# Patient Record
Sex: Female | Born: 1985
Health system: Southern US, Community
[De-identification: ages and names within clinical notes are randomized; demographics above are authoritative.]

## PROBLEM LIST (undated history)

## (undated) DIAGNOSIS — Z973 Presence of spectacles and contact lenses: Secondary | ICD-10-CM

## (undated) DIAGNOSIS — J302 Other seasonal allergic rhinitis: Secondary | ICD-10-CM

## (undated) DIAGNOSIS — D509 Iron deficiency anemia, unspecified: Secondary | ICD-10-CM

## (undated) DIAGNOSIS — L659 Nonscarring hair loss, unspecified: Secondary | ICD-10-CM

## (undated) DIAGNOSIS — K603 Anal fistula, unspecified: Secondary | ICD-10-CM

## (undated) DIAGNOSIS — Z8719 Personal history of other diseases of the digestive system: Secondary | ICD-10-CM

## (undated) DIAGNOSIS — Z9109 Other allergy status, other than to drugs and biological substances: Secondary | ICD-10-CM

## (undated) DIAGNOSIS — J3089 Other allergic rhinitis: Secondary | ICD-10-CM

## (undated) DIAGNOSIS — J45909 Unspecified asthma, uncomplicated: Secondary | ICD-10-CM

## (undated) DIAGNOSIS — E049 Nontoxic goiter, unspecified: Secondary | ICD-10-CM

## (undated) DIAGNOSIS — K589 Irritable bowel syndrome without diarrhea: Secondary | ICD-10-CM

## (undated) DIAGNOSIS — F419 Anxiety disorder, unspecified: Secondary | ICD-10-CM

## (undated) HISTORY — DX: Nonscarring hair loss, unspecified: L65.9

## (undated) HISTORY — PX: COLONOSCOPY: SHX174

## (undated) HISTORY — DX: Anxiety disorder, unspecified: F41.9

---
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
## 2008-06-12 ENCOUNTER — Encounter: Payer: Self-pay | Admitting: Internal Medicine

## 2008-08-15 ENCOUNTER — Encounter (INDEPENDENT_AMBULATORY_CARE_PROVIDER_SITE_OTHER): Payer: Self-pay | Admitting: *Deleted

## 2008-08-28 ENCOUNTER — Ambulatory Visit: Payer: Self-pay | Admitting: Internal Medicine

## 2008-08-28 DIAGNOSIS — F3289 Other specified depressive episodes: Secondary | ICD-10-CM | POA: Insufficient documentation

## 2008-08-28 DIAGNOSIS — J45909 Unspecified asthma, uncomplicated: Secondary | ICD-10-CM | POA: Insufficient documentation

## 2008-08-28 DIAGNOSIS — J309 Allergic rhinitis, unspecified: Secondary | ICD-10-CM | POA: Insufficient documentation

## 2008-08-28 DIAGNOSIS — F411 Generalized anxiety disorder: Secondary | ICD-10-CM | POA: Insufficient documentation

## 2008-08-28 DIAGNOSIS — F329 Major depressive disorder, single episode, unspecified: Secondary | ICD-10-CM | POA: Insufficient documentation

## 2008-08-29 LAB — CONVERTED CEMR LAB
Alkaline Phosphatase: 56 units/L (ref 39–117)
Basophils Absolute: 0 10*3/uL (ref 0.0–0.1)
Bilirubin Urine: NEGATIVE
Bilirubin, Direct: 0.1 mg/dL (ref 0.0–0.3)
Calcium: 9 mg/dL (ref 8.4–10.5)
Crystals: NEGATIVE
Eosinophils Absolute: 0 10*3/uL (ref 0.0–0.7)
GFR calc Af Amer: 158 mL/min
GFR calc non Af Amer: 131 mL/min
HCT: 37.3 % (ref 36.0–46.0)
HDL: 64.5 mg/dL (ref >39.0)
Hemoglobin, Urine: NEGATIVE
Ketones, ur: NEGATIVE mg/dL
MCHC: 34.9 g/dL (ref 30.0–36.0)
MCV: 83.7 fL (ref 78.0–100.0)
Monocytes Absolute: 0.4 10*3/uL (ref 0.1–1.0)
Monocytes Relative: 5.1 % (ref 3.0–12.0)
Nitrite: NEGATIVE
Platelets: 260 10*3/uL (ref 150–400)
Potassium: 4 meq/L (ref 3.5–5.1)
RBC / HPF: NONE SEEN
RDW: 12.1 % (ref 11.5–14.6)
Sodium: 140 meq/L (ref 135–145)
TSH: 2.73 microintl units/mL (ref 0.35–5.50)
Total CHOL/HDL Ratio: 3.3
Total Protein, Urine: NEGATIVE mg/dL
Triglycerides: 137 mg/dL (ref 0–149)

## 2009-06-14 ENCOUNTER — Encounter: Admission: RE | Admit: 2009-06-14 | Discharge: 2009-06-14 | Payer: Self-pay | Admitting: Family Medicine

## 2012-04-01 ENCOUNTER — Encounter (INDEPENDENT_AMBULATORY_CARE_PROVIDER_SITE_OTHER): Payer: Self-pay | Admitting: Surgery

## 2012-04-01 ENCOUNTER — Ambulatory Visit (INDEPENDENT_AMBULATORY_CARE_PROVIDER_SITE_OTHER): Payer: BC Managed Care – PPO | Admitting: Surgery

## 2012-04-01 ENCOUNTER — Encounter (INDEPENDENT_AMBULATORY_CARE_PROVIDER_SITE_OTHER): Payer: Self-pay | Admitting: General Surgery

## 2012-04-01 VITALS — BP 118/78 | HR 76 | Temp 98.3°F | Resp 16 | Ht 63.0 in | Wt 164.0 lb

## 2012-04-01 DIAGNOSIS — K611 Rectal abscess: Secondary | ICD-10-CM

## 2012-04-01 DIAGNOSIS — K612 Anorectal abscess: Secondary | ICD-10-CM

## 2012-04-01 MED ORDER — OXYCODONE-ACETAMINOPHEN 5-325 MG PO TABS: 1.0000 | ORAL_TABLET | Freq: Four times a day (QID) | ORAL | Status: DC | PRN

## 2012-04-01 MED ORDER — AMOXICILLIN-POT CLAVULANATE 875-125 MG PO TABS: 1.0000 | ORAL_TABLET | Freq: Two times a day (BID) | ORAL | Status: AC

## 2012-04-01 NOTE — Progress Notes (Signed)
URGENT Office Renee Bryan 26 y.o.  Body mass index is 29.05 kg/(m^2).  There is no problem list on file for this patient.   Allergies  Allergen Reactions  . Zoloft (Sertraline Hcl)     No past surgical history on file. Renee Bryan, [name], MD 1. Peri-rectal abscess     5 day history of gradually worsening perirectal pain.  Now with firm tender area of right buttock.  Informed consent and infiltration with 1%lido with neut.  18 gauge needle probe to reach yellow pus.  Incised and drained.  Packed with iodophor.  Instructions given.  Augmentin and percocet. Perirectal abscess post drainage Return 1 week.    Matt B. Daphine Deutscher, MD, [facility] [phone] beeper [phone]  04/01/2012 6:10 PM

## 2012-04-01 NOTE — Patient Instructions (Signed)
Peri-Rectal Abscess Your caregiver has diagnosed you as having a peri-rectal abscess. This is an infected area near the rectum that is filled with pus. If the abscess is near the surface of the skin, your caregiver may open (incise) the area and drain the pus. HOME CARE INSTRUCTIONS   If your abscess was opened up and drained. A small piece of gauze may be placed in the opening so that it can drain. Do not remove the gauze unless directed by your caregiver.   A loose dressing may be placed over the abscess site. Change the dressing as often as necessary to keep it clean and dry.   After the drain is removed, the area may be washed with a gentle antiseptic (soap) four times per day.   A warm sitz bath, warm packs or heating pad may be used for pain relief, taking care not to burn yourself.   Return for a wound check in 1 day or as directed.   An "inflatable doughnut" may be used for sitting with added comfort. These can be purchased at a drugstore or medical supply house.   To reduce pain and straining with bowel movements, eat a high fiber diet with plenty of fruits and vegetables. Use stool softeners as recommended by your caregiver. This is especially important if narcotic type pain medications were prescribed as these may cause marked constipation.   Only take over-the-counter or prescription medicines for pain, discomfort, or fever as directed by your caregiver.  SEEK IMMEDIATE MEDICAL CARE IF:   You have increasing pain that is not controlled by medication.   There is increased inflammation (redness), swelling, bleeding, or drainage from the area.   An oral temperature above 102 F (38.9 C) develops.   You develop chills or generalized malaise (feel lethargic or feel "washed out").   You develop any new symptoms (problems) you feel may be related to your present problem.  Document Released: 06/26/2000 Document Revised: 06/18/2011 Document Reviewed: 06/26/2008 ExitCare Patient  Information 2012 [employer]. 

## 2012-04-08 ENCOUNTER — Ambulatory Visit (INDEPENDENT_AMBULATORY_CARE_PROVIDER_SITE_OTHER): Payer: BC Managed Care – PPO | Admitting: General Surgery

## 2012-04-08 ENCOUNTER — Encounter (INDEPENDENT_AMBULATORY_CARE_PROVIDER_SITE_OTHER): Payer: Self-pay | Admitting: General Surgery

## 2012-04-08 VITALS — BP 126/74 | HR 70 | Temp 97.6°F | Resp 14 | Ht 63.0 in | Wt 165.4 lb

## 2012-04-08 DIAGNOSIS — K611 Rectal abscess: Secondary | ICD-10-CM

## 2012-04-08 DIAGNOSIS — K612 Anorectal abscess: Secondary | ICD-10-CM

## 2012-04-08 NOTE — Patient Instructions (Signed)
Your abscess is healing well.  Continue your antibiotics for another week.

## 2012-04-08 NOTE — Progress Notes (Signed)
Renee Bryan is a 26 y.o. female who is here for a follow up visit regarding a perirectal abscess.  It was drained 1 week ago.  Her pain and erythema are better but not completely gone.  Objective: Filed Vitals:   04/08/12 1608  BP: 126/74  Pulse: 70  Temp: 97.6 F (36.4 C)  Resp: 14    General appearance: alert, cooperative and no distress no perirectal erythema/edema, mild tenderness to deep palpation   Assessment and Plan: Healing well  RTC PRN    .Vanita Panda, MD [facility], [place] [phone]

## 2014-04-04 LAB — OB RESULTS CONSOLE RUBELLA ANTIBODY, IGM: Rubella: IMMUNE

## 2014-04-04 LAB — OB RESULTS CONSOLE GC/CHLAMYDIA
CHLAMYDIA, DNA PROBE: NEGATIVE
Gonorrhea: NEGATIVE

## 2014-04-04 LAB — OB RESULTS CONSOLE RPR: RPR: NONREACTIVE

## 2014-04-04 LAB — OB RESULTS CONSOLE HEPATITIS B SURFACE ANTIGEN: Hepatitis B Surface Ag: NEGATIVE

## 2014-04-04 LAB — OB RESULTS CONSOLE ABO/RH: RH TYPE: NEGATIVE

## 2014-04-04 LAB — OB RESULTS CONSOLE HIV ANTIBODY (ROUTINE TESTING): HIV: NONREACTIVE

## 2014-05-13 ENCOUNTER — Inpatient Hospital Stay (HOSPITAL_COMMUNITY): Admission: AD | Admit: 2014-05-13 | Payer: Self-pay | Source: Ambulatory Visit | Admitting: Obstetrics

## 2014-07-13 NOTE — L&D Delivery Note (Signed)
Delivery Note At 4:05 AM a viable and healthy female was delivered via Vaginal, Spontaneous Delivery (Presentation: Left Occiput Anterior).  Right hand presentation APGAR: 8, 9; weight pending .   Placenta status: Intact, Spontaneous. Not sent Cord: 3 vessels with the following complications: Short.  Cord pH: none  Anesthesia: Epidural  Episiotomy: None Lacerations: 2nd degree perineal ; left vaginal Sulcus; right Periurethral Suture Repair: 3.0 chromic Est. Blood Loss (mL): 300    Mom to postpartum.  Baby to Couplet care / Skin to Skin.  Renee Bryan A 11/03/2014, 4:56 AM

## 2014-10-12 LAB — OB RESULTS CONSOLE GBS: STREP GROUP B AG: NEGATIVE

## 2014-11-02 ENCOUNTER — Encounter (HOSPITAL_COMMUNITY): Payer: Self-pay | Admitting: *Deleted

## 2014-11-02 ENCOUNTER — Inpatient Hospital Stay (HOSPITAL_COMMUNITY): Payer: BLUE CROSS/BLUE SHIELD | Admitting: Anesthesiology

## 2014-11-02 ENCOUNTER — Inpatient Hospital Stay (HOSPITAL_COMMUNITY)
Admission: AD | Admit: 2014-11-02 | Discharge: 2014-11-05 | DRG: 775 | Disposition: A | Discharge status: Home / Self Care | Payer: BLUE CROSS/BLUE SHIELD | Source: Ambulatory Visit | Attending: Obstetrics and Gynecology | Admitting: Obstetrics and Gynecology

## 2014-11-02 DIAGNOSIS — Z3A38 38 weeks gestation of pregnancy: Secondary | ICD-10-CM | POA: Diagnosis present

## 2014-11-02 DIAGNOSIS — O4292 Full-term premature rupture of membranes, unspecified as to length of time between rupture and onset of labor: Secondary | ICD-10-CM | POA: Diagnosis present

## 2014-11-02 DIAGNOSIS — Z23 Encounter for immunization: Secondary | ICD-10-CM

## 2014-11-02 DIAGNOSIS — Z141 Cystic fibrosis carrier: Secondary | ICD-10-CM | POA: Diagnosis not present

## 2014-11-02 DIAGNOSIS — O4290 Premature rupture of membranes, unspecified as to length of time between rupture and onset of labor, unspecified weeks of gestation: Secondary | ICD-10-CM | POA: Diagnosis present

## 2014-11-02 LAB — CBC
HCT: 32.9 % — ABNORMAL LOW (ref 36.0–46.0)
Hemoglobin: 11 g/dL — ABNORMAL LOW (ref 12.0–15.0)
MCH: 28.4 pg (ref 26.0–34.0)
MCHC: 33.4 g/dL (ref 30.0–36.0)
MCV: 84.8 fL (ref 78.0–100.0)
PLATELETS: 206 10*3/uL (ref 150–400)
RBC: 3.88 MIL/uL (ref 3.87–5.11)
RDW: 14 % (ref 11.5–15.5)
WBC: 9.9 10*3/uL (ref 4.0–10.5)

## 2014-11-02 MED ORDER — OXYTOCIN 10 UNIT/ML IJ SOLN: 10.0000 [IU] | Freq: Once | INTRAMUSCULAR | Status: DC

## 2014-11-02 MED ORDER — PHENYLEPHRINE 40 MCG/ML (10ML) SYRINGE FOR IV PUSH (FOR BLOOD PRESSURE SUPPORT)
80.0000 ug | PREFILLED_SYRINGE | INTRAVENOUS | Status: DC | PRN
  Filled 2014-11-02: qty 20
  Filled 2014-11-02: qty 2

## 2014-11-02 MED ORDER — SODIUM CHLORIDE 0.9 % IV SOLN: 2.0000 g | Freq: Once | INTRAVENOUS | Status: DC

## 2014-11-02 MED ORDER — EPHEDRINE 5 MG/ML INJ
10.0000 mg | INTRAVENOUS | Status: DC | PRN
  Filled 2014-11-02: qty 2

## 2014-11-02 MED ORDER — OXYTOCIN BOLUS FROM INFUSION: 500.0000 mL | INTRAVENOUS | Status: DC

## 2014-11-02 MED ORDER — CITRIC ACID-SODIUM CITRATE 334-500 MG/5ML PO SOLN: 30.0000 mL | ORAL | Status: DC | PRN

## 2014-11-02 MED ORDER — CLINDAMYCIN PHOSPHATE 900 MG/50ML IV SOLN: 900.0000 mg | Freq: Three times a day (TID) | INTRAVENOUS | Status: DC

## 2014-11-02 MED ORDER — ONDANSETRON HCL 4 MG/2ML IJ SOLN: 4.0000 mg | Freq: Four times a day (QID) | INTRAMUSCULAR | Status: DC | PRN

## 2014-11-02 MED ORDER — LACTATED RINGERS IV SOLN
500.0000 mL | Freq: Once | INTRAVENOUS | Status: AC
  Administered 2014-11-02: 500 mL via INTRAVENOUS

## 2014-11-02 MED ORDER — LACTATED RINGERS IV SOLN
INTRAVENOUS | Status: DC
Start: 2014-11-02 — End: 2014-11-03
  Administered 2014-11-02: 20:00:00 via INTRAVENOUS

## 2014-11-02 MED ORDER — FENTANYL 2.5 MCG/ML BUPIVACAINE 1/10 % EPIDURAL INFUSION (WH - ANES)
14.0000 mL/h | INTRAMUSCULAR | Status: DC | PRN
  Administered 2014-11-02: 14 mL/h via EPIDURAL
  Filled 2014-11-02: qty 125

## 2014-11-02 MED ORDER — DIPHENHYDRAMINE HCL 50 MG/ML IJ SOLN: 12.5000 mg | INTRAMUSCULAR | Status: DC | PRN

## 2014-11-02 MED ORDER — OXYTOCIN 40 UNITS IN LACTATED RINGERS INFUSION - SIMPLE MED
1.0000 m[IU]/min | INTRAVENOUS | Status: DC
  Administered 2014-11-02: 2 m[IU]/min via INTRAVENOUS
  Filled 2014-11-02: qty 1000

## 2014-11-02 MED ORDER — OXYCODONE-ACETAMINOPHEN 5-325 MG PO TABS: 2.0000 | ORAL_TABLET | ORAL | Status: DC | PRN

## 2014-11-02 MED ORDER — LIDOCAINE HCL (PF) 1 % IJ SOLN
30.0000 mL | INTRAMUSCULAR | Status: DC | PRN
  Administered 2014-11-03: 30 mL via SUBCUTANEOUS
  Filled 2014-11-02: qty 30

## 2014-11-02 MED ORDER — LACTATED RINGERS IV SOLN
500.0000 mL | INTRAVENOUS | Status: DC | PRN
  Administered 2014-11-03: 500 mL via INTRAVENOUS

## 2014-11-02 MED ORDER — BUTORPHANOL TARTRATE 1 MG/ML IJ SOLN
1.0000 mg | Freq: Once | INTRAMUSCULAR | Status: DC
  Filled 2014-11-02: qty 1

## 2014-11-02 MED ORDER — LIDOCAINE HCL (PF) 1 % IJ SOLN
INTRAMUSCULAR | Status: DC | PRN
  Administered 2014-11-02: 6 mL
  Administered 2014-11-02: 4 mL

## 2014-11-02 MED ORDER — OXYTOCIN 40 UNITS IN LACTATED RINGERS INFUSION - SIMPLE MED: 62.5000 mL/h | INTRAVENOUS | Status: DC

## 2014-11-02 MED ORDER — ACETAMINOPHEN 325 MG PO TABS: 650.0000 mg | ORAL_TABLET | ORAL | Status: DC | PRN

## 2014-11-02 MED ORDER — OXYCODONE-ACETAMINOPHEN 5-325 MG PO TABS: 1.0000 | ORAL_TABLET | ORAL | Status: DC | PRN

## 2014-11-02 MED ORDER — TERBUTALINE SULFATE 1 MG/ML IJ SOLN: 0.2500 mg | Freq: Once | INTRAMUSCULAR | Status: AC | PRN

## 2014-11-02 MED ORDER — PHENYLEPHRINE 40 MCG/ML (10ML) SYRINGE FOR IV PUSH (FOR BLOOD PRESSURE SUPPORT)
80.0000 ug | PREFILLED_SYRINGE | INTRAVENOUS | Status: DC | PRN
  Administered 2014-11-02: 80 ug via INTRAVENOUS
  Filled 2014-11-02: qty 2

## 2014-11-02 NOTE — Anesthesia Preprocedure Evaluation (Signed)
Anesthesia Evaluation  Patient identified by MRN, date of birth, ID band Patient awake    Reviewed: Allergy & Precautions, H&P , Patient's Chart, lab work & pertinent test results  Airway Mallampati: II  TM Distance: >3 FB Neck ROM: full    Dental  (+) Teeth Intact   Pulmonary asthma ,  breath sounds clear to auscultation        Cardiovascular Rhythm:regular Rate:Normal     Neuro/Psych    GI/Hepatic   Endo/Other  Morbid obesity  Renal/GU      Musculoskeletal   Abdominal   Peds  Hematology   Anesthesia Other Findings       Reproductive/Obstetrics (+) Pregnancy                             Anesthesia Physical Anesthesia Plan  ASA: III  Anesthesia Plan: Epidural   Post-op Pain Management:    Induction:   Airway Management Planned:   Additional Equipment:   Intra-op Plan:   Post-operative Plan:   Informed Consent: I have reviewed the patients History and Physical, chart, labs and discussed the procedure including the risks, benefits and alternatives for the proposed anesthesia with the patient or authorized representative who has indicated his/her understanding and acceptance.   Dental Advisory Given  Plan Discussed with:   Anesthesia Plan Comments: (Labs checked- platelets confirmed with RN in room. Fetal heart tracing, per RN, reported to be stable enough for sitting procedure. Discussed epidural, and patient consents to the procedure:  included risk of possible headache,backache, failed block, allergic reaction, and nerve injury. This patient was asked if she had any questions or concerns before the procedure started.)        Anesthesia Quick Evaluation  

## 2014-11-02 NOTE — Anesthesia Procedure Notes (Signed)
Epidural Patient location during procedure: OB  Preanesthetic Checklist Completed: patient identified, site marked, surgical consent, pre-op evaluation, timeout performed, IV checked, risks and benefits discussed and monitors and equipment checked  Epidural Patient position: sitting Prep: site prepped and draped and DuraPrep Patient monitoring: continuous pulse ox and blood pressure Approach: midline Injection technique: LOR air  Needle:  Needle type: Tuohy  Needle gauge: 17 G Needle length: 9 cm and 9 Needle insertion depth: 7 cm Catheter type: closed end flexible Catheter size: 19 Gauge Catheter at skin depth: 14 cm Test dose: negative  Assessment Events: blood not aspirated, injection not painful, no injection resistance, negative IV test and no paresthesia  Additional Notes Dosing of Epidural:  1st dose, through catheter .............................................  Xylocaine 40 mg  2nd dose, through catheter, after waiting 3 minutes.........Xylocaine 60 mg    ( 1% Xylo charted as a single dose in Epic Meds for ease of charting; actual dosing was fractionated as above, for saftey's sake)  As each dose occurred, patient was free of IV sx; and patient exhibited no evidence of SA injection.  Patient is more comfortable after epidural dosed. Please see RN's note for documentation of vital signs,and FHR which are stable.  Patient reminded not to try to ambulate with numb legs, and that an RN must be present when she attempts to get up.       

## 2014-11-02 NOTE — H&P (Signed)
Renee Bryan is a 29 y.o. female presenting @ 1538 5/7 weeks  as a direct admit from the office due to SROM clear fluid. (-) GBS cx  Maternal Medical History:  Reason for admission: Rupture of membranes.   Contractions: Frequency: irregular.    Fetal activity: Perceived fetal activity is normal.    Prenatal complications: no prenatal complications   OB History    Gravida Para Term Preterm AB TAB SAB Ectopic Multiple Living   2     1         Past Medical History  Diagnosis Date  . Perirectal abscess    D&E( IPAS) for MAB Family History: family history is not on file. Social History:  reports that she has never smoked. She does not have any smokeless tobacco history on file. She reports that she drinks alcohol. She reports that she does not use illicit drugs.   Prenatal Transfer Tool  Maternal Diabetes: No Genetic Screening: Normal Maternal Ultrasounds/Referrals: Normal Fetal Ultrasounds or other Referrals:  None Maternal Substance Abuse:  No Significant Maternal Medications:  None Significant Maternal Lab Results:  Lab values include: Group B Strep negative, Rh negative Other Comments:  CF carrier( mother) FOB neg  Review of Systems  All other systems reviewed and are negative.   Dilation: 3 Effacement (%): Thick Station: -3 Exam by:: Renee Bickle, md Blood pressure 120/89, pulse 76, temperature 98.2 F (36.8 C), temperature source Oral, resp. rate 18, height 5\' 3"  (1.6 m), weight 107.502 kg (237 lb). Maternal Exam:  Uterine Assessment: Contraction strength is mild.  Abdomen: Patient reports no abdominal tenderness. Estimated fetal weight is 6 lb.   Fetal presentation: vertex  Introitus: Normal vulva. Normal vagina.  Pelvis: adequate for delivery.   Cervix: Cervix evaluated by digital exam.     Physical Exam  Constitutional: She is oriented to person, place, and time. She appears well-developed and well-nourished.  Eyes: EOM are normal.  Neck: Neck supple.   Cardiovascular: Regular rhythm.   Respiratory: Breath sounds normal.  GI: Soft.  Musculoskeletal: She exhibits edema.  Neurological: She is alert and oriented to person, place, and time.  Skin: Skin is warm and dry.  Psychiatric: She has a normal mood and affect.    Prenatal labs: ABO, Rh: --/--/B NEG (04/22 1730) Antibody: POS (04/22 1730) Rubella: Immune (09/23 0000) RPR: Nonreactive (09/23 0000)  HBsAg: Negative (09/23 0000)  HIV: Non-reactive (09/23 0000)  GBS: Negative (04/01 0000)   Assessment/Plan: SROM Latent phase IUP @ 38 5/7 weeks P) admit routine labs. Pitocin augmentation. Analgesic prn   Renee Bryan A 11/02/2014, 7:59 PM

## 2014-11-03 ENCOUNTER — Encounter (HOSPITAL_COMMUNITY): Payer: Self-pay

## 2014-11-03 LAB — RPR: RPR: NONREACTIVE

## 2014-11-03 MED ORDER — WITCH HAZEL-GLYCERIN EX PADS: 1.0000 "application " | MEDICATED_PAD | CUTANEOUS | Status: DC | PRN

## 2014-11-03 MED ORDER — DIBUCAINE 1 % RE OINT: 1.0000 "application " | TOPICAL_OINTMENT | RECTAL | Status: DC | PRN

## 2014-11-03 MED ORDER — ONDANSETRON HCL 4 MG PO TABS: 4.0000 mg | ORAL_TABLET | ORAL | Status: DC | PRN

## 2014-11-03 MED ORDER — ZOLPIDEM TARTRATE 5 MG PO TABS: 5.0000 mg | ORAL_TABLET | Freq: Every evening | ORAL | Status: DC | PRN

## 2014-11-03 MED ORDER — PNEUMOCOCCAL VAC POLYVALENT 25 MCG/0.5ML IJ INJ
0.5000 mL | INJECTION | INTRAMUSCULAR | Status: AC
  Administered 2014-11-04: 0.5 mL via INTRAMUSCULAR
  Filled 2014-11-03 (×2): qty 0.5

## 2014-11-03 MED ORDER — ONDANSETRON HCL 4 MG/2ML IJ SOLN: 4.0000 mg | INTRAMUSCULAR | Status: DC | PRN

## 2014-11-03 MED ORDER — PRENATAL MULTIVITAMIN CH
1.0000 | ORAL_TABLET | Freq: Every day | ORAL | Status: DC
  Administered 2014-11-03 – 2014-11-05 (×3): 1 via ORAL
  Filled 2014-11-03 (×3): qty 1

## 2014-11-03 MED ORDER — OXYCODONE-ACETAMINOPHEN 5-325 MG PO TABS: 1.0000 | ORAL_TABLET | ORAL | Status: DC | PRN

## 2014-11-03 MED ORDER — FERROUS SULFATE 325 (65 FE) MG PO TABS
325.0000 mg | ORAL_TABLET | Freq: Two times a day (BID) | ORAL | Status: DC
  Administered 2014-11-03 – 2014-11-05 (×5): 325 mg via ORAL
  Filled 2014-11-03 (×5): qty 1

## 2014-11-03 MED ORDER — SIMETHICONE 80 MG PO CHEW: 80.0000 mg | CHEWABLE_TABLET | ORAL | Status: DC | PRN

## 2014-11-03 MED ORDER — LORATADINE 10 MG PO TABS
10.0000 mg | ORAL_TABLET | Freq: Every day | ORAL | Status: DC
  Filled 2014-11-03: qty 1

## 2014-11-03 MED ORDER — LANOLIN HYDROUS EX OINT: TOPICAL_OINTMENT | CUTANEOUS | Status: DC | PRN

## 2014-11-03 MED ORDER — DIPHENHYDRAMINE HCL 25 MG PO CAPS: 25.0000 mg | ORAL_CAPSULE | Freq: Four times a day (QID) | ORAL | Status: DC | PRN

## 2014-11-03 MED ORDER — ACETAMINOPHEN 325 MG PO TABS: 650.0000 mg | ORAL_TABLET | ORAL | Status: DC | PRN

## 2014-11-03 MED ORDER — OXYCODONE-ACETAMINOPHEN 5-325 MG PO TABS: 2.0000 | ORAL_TABLET | ORAL | Status: DC | PRN

## 2014-11-03 MED ORDER — IBUPROFEN 600 MG PO TABS
600.0000 mg | ORAL_TABLET | Freq: Four times a day (QID) | ORAL | Status: DC
  Administered 2014-11-03 – 2014-11-05 (×9): 600 mg via ORAL
  Filled 2014-11-03 (×9): qty 1

## 2014-11-03 MED ORDER — ALBUTEROL SULFATE (2.5 MG/3ML) 0.083% IN NEBU: 3.0000 mL | INHALATION_SOLUTION | Freq: Four times a day (QID) | RESPIRATORY_TRACT | Status: DC | PRN

## 2014-11-03 MED ORDER — SENNOSIDES-DOCUSATE SODIUM 8.6-50 MG PO TABS
2.0000 | ORAL_TABLET | ORAL | Status: DC
  Administered 2014-11-03 – 2014-11-04 (×2): 2 via ORAL
  Filled 2014-11-03 (×2): qty 2

## 2014-11-03 MED ORDER — FLUTICASONE PROPIONATE 50 MCG/ACT NA SUSP
1.0000 | Freq: Every day | NASAL | Status: DC
  Filled 2014-11-03: qty 16

## 2014-11-03 MED ORDER — BENZOCAINE-MENTHOL 20-0.5 % EX AERO: 1.0000 "application " | INHALATION_SPRAY | CUTANEOUS | Status: DC | PRN

## 2014-11-03 NOTE — Anesthesia Postprocedure Evaluation (Signed)
  Anesthesia Post-op Note  Patient: Renee Bryan  Procedure(s) Performed: * No procedures listed *  Patient Location: Mother/Baby  Anesthesia Type:Epidural  Level of Consciousness: awake and alert   Airway and Oxygen Therapy: Patient Spontanous Breathing  Post-op Pain: mild  Post-op Assessment: Post-op Vital signs reviewed, Patient's Cardiovascular Status Stable, Respiratory Function Stable, No signs of Nausea or vomiting, Pain level controlled, No headache, No residual numbness and No residual motor weakness  Post-op Vital Signs: Reviewed  Last Vitals:  Filed Vitals:   11/03/14 0815  BP: 101/54  Pulse: 68  Temp: 37.1 C  Resp: 18    Complications: No apparent anesthesia complications

## 2014-11-03 NOTE — Progress Notes (Signed)
Interval Note:  S: Feels well. Pain well controlled.  O: VSS  A: S/p SVD  P: PPD #0     Continue routine postpartum care.

## 2014-11-03 NOTE — Lactation Note (Signed)
This note was copied from the chart of Renee Nell RangeLauren Laguna. Lactation Consultation Note  Patient Name: Renee Bryan ZOXWR'UToday's Date: 11/03/2014 Reason for consult: Follow-up assessment;Difficult latch Baby 17 hours of life. Mom states that she has very sensitive nipples and is experiencing pain while nursing baby. Mom able to hand express colostrum. Assisted with latching baby direct to breast. Repositions mom and her pillows and demonstrated "teacup" hold, and mom states she has increased comfort. After baby nursing rhythmically for 10 minutes, examined mom's breast and there was a slight compressed area on mom's right nipple. Discussed with mom that baby's mouth is somewhat tight, and when given LC's gloved finger to suckle, baby's tongue humps LC's finger. Discussed with mom that if this continues after her milk comes in, and/or she continues to have sore nipples, she should discuss baby's pediatrician. Mom states that she is very tired, and asks about using a NS, stating that her sister used one. Because mom complained of pain throughout feeding, fitted mom with a #24 NS and mom reported increased comfort. Discussed with mom the need to post-pump in order to protect her milk supply. Also discussed the need to transition baby away from NS, and OP appointment.   Plan is for mom to use NS tonight, and then add use of DEBP for 15 minutes after each BF starting in the morning. Enc mom to hand express after pumping, and then give baby EBM using curve-tipped syringe and NS. Enc mom to nurse with cues and roughly 8-12 times/24 hours. FOB at bedside assisting mom. Enc parents to call for assistance as needed.   Maternal Data Has patient been taught Hand Expression?: Yes Does the patient have breastfeeding experience prior to this delivery?: No  Feeding Feeding Type: Breast Fed Length of feed:  (LC assess 15 minutes of this second latch with NS.)  LATCH Score/Interventions Latch: Grasps breast easily,  tongue down, lips flanged, rhythmical sucking. Intervention(s): Adjust position;Assist with latch  Audible Swallowing: A few with stimulation Intervention(s): Skin to skin;Hand expression  Type of Nipple: Flat Intervention(s): Hand pump;Double electric pump;Shells  Comfort (Breast/Nipple): Filling, red/small blisters or bruises, mild/mod discomfort  Problem noted: Mild/Moderate discomfort Interventions (Mild/moderate discomfort): Hand expression;Post-pump;Pre-pump if needed  Hold (Positioning): Assistance needed to correctly position infant at breast and maintain latch. Intervention(s): Breastfeeding basics reviewed;Support Pillows;Position options;Skin to skin  LATCH Score: 6  Lactation Tools Discussed/Used Pump Review: Setup, frequency, and cleaning Initiated by:: Milagros Reaponna Esker, RN Date initiated:: 11/03/14   Consult Status Consult Status: Follow-up Date: 11/04/14 Follow-up type: In-patient    Geralynn OchsWILLIARD, Dev Dhondt 11/03/2014, 9:16 PM

## 2014-11-03 NOTE — Progress Notes (Signed)
S: c/o intermittent anal pressure  O: epidural Pitocin VE fully (+) 1 asynclitic  some overriding sutures  Tracing: baseline 145 (+) early decel Ctx q 2 -3 mins  IMP: Complete P) right exaggerated sims position. Labor vertex down to allow rotation

## 2014-11-04 LAB — CBC
HCT: 29.1 % — ABNORMAL LOW (ref 36.0–46.0)
Hemoglobin: 9.6 g/dL — ABNORMAL LOW (ref 12.0–15.0)
MCH: 28.4 pg (ref 26.0–34.0)
MCHC: 33 g/dL (ref 30.0–36.0)
MCV: 86.1 fL (ref 78.0–100.0)
Platelets: 171 10*3/uL (ref 150–400)
RBC: 3.38 MIL/uL — ABNORMAL LOW (ref 3.87–5.11)
RDW: 14.1 % (ref 11.5–15.5)
WBC: 9.7 10*3/uL (ref 4.0–10.5)

## 2014-11-04 MED ORDER — RHO D IMMUNE GLOBULIN 1500 UNIT/2ML IJ SOSY
300.0000 ug | PREFILLED_SYRINGE | Freq: Once | INTRAMUSCULAR | Status: AC
  Administered 2014-11-04: 300 ug via INTRAVENOUS
  Filled 2014-11-04: qty 2

## 2014-11-04 NOTE — Progress Notes (Signed)
CLINICAL SOCIAL WORK MATERNAL/CHILD NOTE  Patient Details  Name: Renee Bryan MRN: [id number] Date of Birth: 11/03/2016  Date: 11/04/2014  Clinical Social Worker Initiating Note: Durell Lofaso, LCSWDate/ Time Initiated: 11/04/14/1045   Child's Name: Renee Bryan   Legal Guardian:  (Parents)   Need for Interpreter: None   Date of Referral: 11/03/14   Reason for Referral:  (Hx of anxiety and depression)   Referral Source: [facility]   Address: [address] [place], [place] [address]  Phone number:  [phone])   Household Members: Parents   Natural Supports (not living in the home): Extended Family, Spouse/significant other   Professional Supports:None   Employment: (Both parents employed)   Type of Work:  (n/a)   Education:  (n/a)   Printmaker   Other Resources:     Cultural/Religious Considerations Which May Impact Care: none identified  Strengths: Ability to meet basic needs , Compliance with medical plan , Pediatrician chosen , Home prepared for child    Risk Factors/Current Problems: None   Cognitive State: Alert , Goal Oriented    Mood/Affect: Bright , Happy    CSW Assessment: Acknowledged order for social work consult to assess mother's hx of Depression. Met with mother who was pleasant and receptive to social work. Parents are married and have no other dependents. Mother reports that 9 years ago she went to see a Physician for something unrelated and he diagnosed her with depression and started her on medication which she took for only two weeks due to the side effects. Mother states belief that she was misdiagnosed at the time. She reported no other incidence. She reports having a really good support system. No acute social concerns noted or reported at this time. Mother informed of social work Fish farm manager.  CSW Plan/Description:     Patient/Family Education - PP Depression information and resources No further intervention required No barriers to discharge   Renee Bryan J, LCSW 11/04/2014, 3:52 PM

## 2014-11-04 NOTE — Progress Notes (Signed)
PPD 1 SVD  S:  Reports feeling sore and tired             Tolerating po/ No nausea or vomiting             Bleeding is moderate             Pain controlled with motrin and percocet             Up ad lib / ambulatory / voiding QS  Newborn breast feeding  O:               VS: BP 105/60 mmHg  Pulse 70  Temp(Src) 98.3 F (36.8 C) (Oral)  Resp 18  Ht 5\' 3"  (1.6 m)  Wt 107.502 kg (237 lb)  BMI 41.99 kg/m2  SpO2 100%  Breastfeeding? Unknown   LABS:              Recent Labs  11/02/14 1730 11/04/14 0637  WBC 9.9 9.7  HGB 11.0* 9.6*  PLT 206 171               Blood type: --/--/B NEG (04/24 16100637)   / newborn type and RH pending  Rubella: Immune (09/23 0000)                              Physical Exam:             Alert and oriented X3  Abdomen: soft, non-tender, non-distended              Fundus: firm, non-tender, U-even  Perineum: moderate edema / ice pack in place  Lochia: moderate  Extremities: 1+ edema, no calf pain or tenderness    A: PPD # 1   Doing well - stable status  P: Routine post partum orders  Anticipate DC tomorrow             Increase water hydration / comfort measures reviewed for perineal discomfort             Rhogam as indicated  Susa LofflerBAILEY, Lynnetta Tom CNM, MSN, [facility] CityFACNM 11/04/2014, 9:29 AM

## 2014-11-04 NOTE — Lactation Note (Signed)
This note was copied from the chart of Renee Nell RangeLauren Mory. Lactation Consultation Note  Patient Name: Renee Bryan DGUYQ'IToday's Date: 11/04/2014 Reason for consult: Follow-up assessment Baby 37 hours of life. [facility]. Baby nursed for 25 minutes, with a few swallows noted, and colostrum in NS. Mom states that baby has had some good nursing sessions, 20-40 minutes long with swallows noted and colostrum in NS, but then has also had short feedings where she is frustrated at breasts. Mom states that she has some nipple soreness, but nipples appear normal. Mom states she has very sensitive nipples. Mom given comfort gels with instructions. Mom states that she was just too tired to start pumping last night. Mom able to demonstrate hand expression with colostrum dripping from both breast. Got mom started pumping and colostrum did flow. Mom states that she has a DEBP coming, but not sure when. Parents enc to call insurance company in morning and given paperwork for 2-week hospital DEBP rental as needed.  Baby has lost 5%, but has good void/stool output, and parents state baby seems satiated after longer feeds with swallows. Plan is for mom to offer breasts when baby cues to nurse, then supplement with EBM using curve-tipped syringe and NS. Enc mom to post-pump after feeding, and hand express after pumping. Enc parents to call for assistance with latching/pumping/supplementing as needed. Discussed assessment, interventions, and plan with patient's RN, Deb.   Mom aware of OP/appointments, and LC phone line assistance after D/C.  Maternal Data    Feeding Feeding Type: Breast Fed Length of feed: 25 min  LATCH Score/Interventions Latch: Grasps breast easily, tongue down, lips flanged, rhythmical sucking.  Audible Swallowing: A few with stimulation Intervention(s): Skin to skin;Hand expression  Type of Nipple: Flat Intervention(s): Double electric pump  Comfort (Breast/Nipple):  Filling, red/small blisters or bruises, mild/mod discomfort  Problem noted: Mild/Moderate discomfort Interventions (Mild/moderate discomfort): Hand expression;Comfort gels;Post-pump;Hand massage  Hold (Positioning): No assistance needed to correctly position infant at breast.  LATCH Score: 7  Lactation Tools Discussed/Used     Consult Status Consult Status: Follow-up Date: 11/05/14 Follow-up type: In-patient    Geralynn OchsWILLIARD, Blanca Carreon 11/04/2014, 5:57 PM

## 2014-11-05 LAB — RH IG WORKUP (INCLUDES ABO/RH)
ABO/RH(D): B NEG
FETAL SCREEN: NEGATIVE
Gestational Age(Wks): 38.6
Unit division: 0

## 2014-11-05 MED ORDER — IBUPROFEN 600 MG PO TABS: 600.0000 mg | ORAL_TABLET | Freq: Four times a day (QID) | ORAL | Status: DC

## 2014-11-05 MED ORDER — FERROUS SULFATE 325 (65 FE) MG PO TABS: 325.0000 mg | ORAL_TABLET | Freq: Two times a day (BID) | ORAL | Status: DC

## 2014-11-05 NOTE — Progress Notes (Signed)
MOB reporting pain with pumping with size 24 flanges. Changed flanges to size 27 for both left and right sides. MOB reports comfort much improved. Sherald BargeMatthews, Tyrea Froberg L

## 2014-11-05 NOTE — Progress Notes (Signed)
Patient ID: Renee ProLauren B. Renee Bryan, female   DOB: 06/23/1986, 29 y.o.   MRN: [id number] Post Partum Day #2            Information for the patient's newborn:  Parke SimmersHart, Girl Lumen [[id number]][[id number]]  female   Feeding: breast  Subjective: No HA, SOB, CP, F/C, breast symptoms: milk has come in. Pain well-controlled with ibuprofen. Normal vaginal bleeding, no clots.      Objective:  Temp:  [98 F (36.7 C)-98.8 F (37.1 C)] 98.8 F (37.1 C) (04/25 0600) Pulse Rate:  [69-72] 69 (04/25 0600) Resp:  [18] 18 (04/25 0600) BP: (115-119)/(55-66) 119/66 mmHg (04/25 0600)    Recent Labs  11/02/14 1730 11/04/14 0637  WBC 9.9 9.7  HGB 11.0* 9.6*  HCT 32.9* 29.1*  PLT 206 171    Blood type: B NEG (04/24 [id number]) / Rhophylac given 11/03/2014 Rubella: Immune (09/23 0000)    Physical Exam:  General: alert, cooperative and no distress Uterine Fundus: firm, U-2 Lochia: appropriate Perineum: 2nd degree repair healing well DVT Evaluation: No evidence of DVT seen on physical exam. Negative Homan's sign. Calf/Ankle 2+ pedal edema is present.  Assessment/Plan: PPD # 2 / 29 y.o., G1P1001 S/P: spontaneous vaginal   Principal Problem:     Postpartum care following vaginal delivery (4/23)   Active Problems:     Ruptured, membranes, premature   Normal postpartum exam  Continue current postpartum care  D/C home   LOS: 3 days   Renee Bryan, Renee Bryan, M, MSN, CNM 11/05/2014, 11:13 AM

## 2014-11-05 NOTE — Lactation Note (Signed)
This note was copied from the chart of Renee Bryan. Lactation Consultation Note  Patient Name: Renee Renee RangeLauren Porchia WUJWJ'XToday's Date: 11/05/2014 Reason for consult: Follow-up assessment (mpm recently breast  fed , pumping with LC checking flanges )  Per mom recently breast fed and dad is supplementing with EBM . Milk is in bilaterally and lateral nodules ( tender noted )  LC assisted  mom to set up DEBP and LC rechecked flanges #27 good fit . Mom pumped 15 mins with 3 oz of EBM yield and relief.  Sore nipple and engorgement prevention and tx reviewed referring to the baby and me booklet. LC [facility] for the use of NS , extra pumping, and mom and dad willing to come in for a F/U apt. Friday April 29 th at 4 pm. Apt reminder given to mom. Mom rented a DEBP with instructions.  Mother informed of post-discharge support and given phone number to the lactation department, including services for phone call assistance;  out-patient appointments; and breastfeeding support group. List of other breastfeeding resources in the community given in the handout. Encouraged  mother to call for problems or concerns related to breastfeeding.   Maternal Data Has patient been taught Hand Expression?: Yes  Feeding Feeding Type: Breast Fed Length of feed: 20 min (per dad )  LATCH Score/Interventions Latch: Grasps breast easily, tongue down, lips flanged, rhythmical sucking. Intervention(s): Adjust position  Audible Swallowing: A few with stimulation  Type of Nipple: Flat  Comfort (Breast/Nipple): Filling, red/small blisters or bruises, mild/mod discomfort  Problem noted: Cracked, bleeding, blisters, bruises;Mild/Moderate discomfort Interventions  (Cracked/bleeding/bruising/blister): Double electric pump Interventions (Mild/moderate discomfort): Comfort gels;Pre-pump if needed;Hand massage;Post-pump  Hold (Positioning): No assistance needed to correctly position infant at  breast. Intervention(s): Breastfeeding basics reviewed (see LC note )  LATCH Score: 7  Lactation Tools Discussed/Used WIC Program: No   Consult Status Consult Status: Follow-up Date: 11/09/14 (at 4 pm ) Follow-up type: Out-patient    Renee Bryan, Fred Franzen Ann 11/05/2014, 10:03 AM

## 2014-11-05 NOTE — Discharge Summary (Signed)
Obstetric Discharge Summary Reason for Admission: onset of labor and rupture of membranes Prenatal Procedures: ultrasound Intrapartum Course: Admitted with SROM / pitocin augmentation / rapid progression to complete dilation / SVD of viable Intrapartum Procedures: spontaneous vaginal delivery Postpartum Procedures: none Complications-Operative and Postpartum: 2nd degree perineal laceration; left vaginal Sulcus; right Periurethral HEMOGLOBIN  Date Value Ref Range Status  11/04/2014 9.6* 12.0 - 15.0 g/dL Final   HCT  Date Value Ref Range Status  11/04/2014 29.1* 36.0 - 46.0 % Final    Physical Exam:  General: alert, cooperative and no distress Lochia: appropriate Uterine Fundus: firm DVT Evaluation: No evidence of DVT seen on physical exam. Negative Homan's sign. No cords or calf tenderness. No significant calf/ankle edema.  Discharge Diagnoses: Term Pregnancy-delivered  Discharge Information: Date: 11/05/2014 Activity: pelvic rest Diet: routine Medications: PNV, Ibuprofen and Iron Condition: stable Instructions: refer to practice specific booklet Discharge to: home Follow-up Information    [facility] Schedule an appointment as soon as possible for a visit in 6 weeks.   Specialty:  Obstetrics and Gynecology   Why:  postpartum visit   Contact information:   [address] [place] [id number] [phone]       Newborn Data: Live born female  Birth Weight: 6 lb 9.2 oz (2982 g) APGAR: 8, 9  Home with mother.  Raelyn MoraAWSON, Barlow Harrison, M MSN, CNM 11/05/2014, 11:26 AM

## 2014-11-05 NOTE — Discharge Instructions (Signed)
Breast Pumping Tips °If you are breastfeeding, there may be times when you cannot feed your baby directly. Returning to work or going on a trip are common examples. Pumping allows you to store breast milk and feed it to your baby later.  °You may not get much milk when you first start to pump. Your breasts should start to make more after a few days. If you pump at the times you usually feed your baby, you may be able to keep making enough milk to feed your baby without also using formula. The more often you pump, the more milk you will produce.  °WHEN SHOULD I PUMP?  °· You can begin to pump soon after delivery. However, some experts recommend waiting about 4 weeks before giving your infant a bottle to make sure breastfeeding is going well.  °· If you plan to return to work, begin pumping a few weeks before. This will help you develop techniques that work best for you. It also lets you build up a supply of breast milk.   °· When you are with your infant, feed on demand and pump after each feeding.   °· When you are away from your infant for several hours, pump for about 15 minutes every 2-3 hours. Pump both breasts at the same time if you can.   °· If your infant has a formula feeding, make sure to pump around the same time.     °· If you drink any alcohol, wait 2 hours before pumping.   °HOW DO I PREPARE TO PUMP? °Your let-down reflex is the natural reaction to stimulation that makes your breast milk flow. It is easier to stimulate this reflex when you are relaxed. Find relaxation techniques that work for you. If you have difficulty with your let-down reflex, try these methods:  °· Smell one of your infant's blankets or an item of clothing.   °· Look at a picture or video of your infant.   °· Sit in a quiet, private space.   °· Massage the breast you plan to pump.   °· Place soothing warmth on the breast.   °· Play relaxing music.   °WHAT ARE SOME GENERAL BREAST PUMPING TIPS? °· Wash your hands before you pump. You  do not need to wash your nipples or breasts. °· There are three ways to pump. °¨ You can use your hand to massage and compress your breast. °¨ You can use a handheld manual pump. °¨ You can use an electric pump.   °· Make sure the suction cup (flange) on the breast pump is the right size. Place the flange directly over the nipple. If it is the wrong size or placed the wrong way, it may be painful and cause nipple damage.   °· If pumping is uncomfortable, apply a small amount of purified or modified lanolin to your nipple and areola. °· If you are using an electric pump, adjust the speed and suction power to be more comfortable. °· If pumping is painful or if you are not getting very much milk, you may need a different type of pump. A lactation consultant can help you determine what type of pump to use.   °· Keep a full water bottle near you at all times. Drinking lots of fluid helps you make more milk.  °· You can store your milk to use later. Pumped breast milk can be stored in a sealable, sterile container or plastic bag. Label all stored breast milk with the date you pumped it. °¨ Milk can stay out at room temperature for up to 8 hours. °¨   You can store your milk in the refrigerator for up to 8 days. °¨ You can store your milk in the freezer for 3 months. Thaw frozen milk using warm water. Do not put it in the microwave. °· Do not smoke. Smoking can lower your milk supply and harm your infant. If you need help quitting, ask your health care provider to recommend a program.   °WHEN SHOULD I CALL MY HEALTH CARE PROVIDER OR A LACTATION CONSULTANT? °· You are having trouble pumping. °· You are concerned that you are not making enough milk. °· You have nipple pain, soreness, or redness. °· You want to use birth control. Birth control pills may lower your milk supply. Talk to your health care provider about your options. °Document Released: 12/17/2009 Document Revised: 07/04/2013 Document Reviewed:  04/21/2013 °Exit[employer]® Patient Information ©2015 [employer]. This information is not intended to replace advice given to you by your health care provider. Make sure you discuss any questions you have with your health care provider. ° °Nutrition for the New Mother  °A new mother needs good health and nutrition so she can have energy to take care of a new baby. Whether a mother breastfeeds or formula feeds the baby, it is important to have a well-balanced diet. Foods from all the food groups should be chosen to meet the new mother's energy needs and to give her the nutrients needed for repair and healing.  °A HEALTHY EATING PLAN °The My [employer] plan for Moms outlines what you should eat to help you and your baby stay healthy. The energy and amount of food you need depends on whether or not you are breastfeeding. If you are breastfeeding you will need more nutrients. If you choose not to breastfeed, your nutrition goal should be to return to a healthy weight. Limiting calories may be needed if you are not breastfeeding.  °HOME CARE INSTRUCTIONS  °· For a personal plan based on your unique needs, see your Registered Dietitian or visit www.mypyramid.gov. °· Eat a variety of foods. The plan below will help guide you. The following chart has a suggested daily meal plan from the [employer] for Moms. °· Eat a variety of fruits and vegetables. °· Eat more dark green and orange vegetables and cooked dried beans. °· Make half your grains whole grains. Choose whole instead of refined grains. °· Choose low-fat or lean meats and poultry. °· Choose low-fat or fat-free dairy products like milk, cheese, or yogurt. °Fruits °· Breastfeeding: 2 cups °· Non-Breastfeeding: 2 cups °· What Counts as a serving? °¨ 1 cup of fruit or juice. °¨ ½ cup dried fruit. °Vegetables °· Breastfeeding: 3 cups °· Non-Breastfeeding: 2 ½ cups °· What Counts as a serving? °¨ 1 cup raw or cooked vegetables. °¨ Juice or 2 cups raw leafy  vegetables. °Grains °· Breastfeeding: 8 oz °· Non-Breastfeeding: 6 oz °· What Counts as a serving? °¨ 1 slice bread. °¨ 1 oz ready-to-eat cereal. °¨ ½ cup cooked pasta, rice, or cereal. °Meat and Beans °· Breastfeeding: 6 ½ oz °· Non-Breastfeeding: 5 ½ oz °· What Counts as a serving? °¨ 1 oz lean meat, poultry, or fish °¨ ¼ cup cooked dry beans °¨ ½ oz nuts or 1 egg °¨ 1 tbs peanut butter °Milk °· Breastfeeding: 3 cups °· Non-Breastfeeding: 3 cups °· What Counts as a serving? °¨ 1 cup milk. °¨ 8 oz yogurt. °¨ 1 ½ oz cheese. °¨ 2 oz processed cheese. °TIPS FOR THE BREASTFEEDING MOM °· Rapid weight   loss is not suggested when you are breastfeeding. By simply breastfeeding, you will be able to lose the weight gained during your pregnancy. Your caregiver can keep track of your weight and tell you if your weight loss is appropriate. °· Be sure to drink fluids. You may notice that you are thirstier than usual. A suggestion is to drink a glass of water or other beverage whenever you breastfeed. °· Avoid alcohol as it can be passed into your breast milk. °· Limit caffeine drinks to no more than 2 to 3 cups per day. °· You may need to keep taking your prenatal vitamin while you are breastfeeding. Talk with your caregiver about taking a vitamin or supplement. °RETURING TO A HEALTHY WEIGHT °· The My [employer] Plan for Moms will help you return to a healthy weight. It will also provide the nutrients you need. °· You may need to limit "empty" calories. These include: °¨ High fat foods like fried foods, fatty meats, fast food, butter, and mayonnaise. °¨ High sugar foods like sodas, jelly, candy, and sweets. °· Be physically active. Include 30 minutes of exercise or more each day. Choose an activity you like such as walking, swimming, biking, or aerobics. Check with your caregiver before you start to exercise. °Document Released: 10/06/2007 Document Revised: 09/21/2011 Document Reviewed: 10/06/2007 °ExitCare® Patient Information  ©2015 [employer]. This information is not intended to replace advice given to you by your health care provider. Make sure you discuss any questions you have with your health care provider. °Postpartum Depression and Baby Blues °The postpartum period begins right after the birth of a baby. During this time, there is often a great amount of joy and excitement. It is also a time of many changes in the life of the parents. Regardless of how many times a mother gives birth, each child brings new challenges and dynamics to the family. It is not unusual to have feelings of excitement along with confusing shifts in moods, emotions, and thoughts. All mothers are at risk of developing postpartum depression or the "baby blues." These mood changes can occur right after giving birth, or they may occur many months after giving birth. The baby blues or postpartum depression can be mild or severe. Additionally, postpartum depression can go away rather quickly, or it can be a long-term condition.  °CAUSES °Raised hormone levels and the rapid drop in those levels are thought to be a main cause of postpartum depression and the baby blues. A number of hormones change during and after pregnancy. Estrogen and progesterone usually decrease right after the delivery of your baby. The levels of thyroid hormone and various cortisol steroids also rapidly drop. Other factors that play a role in these mood changes include major life events and genetics.  °RISK FACTORS °If you have any of the following risks for the baby blues or postpartum depression, know what symptoms to watch out for during the postpartum period. Risk factors that may increase the likelihood of getting the baby blues or postpartum depression include: °· Having a personal or family history of depression.   °· Having depression while being pregnant.   °· Having premenstrual mood issues or mood issues related to oral contraceptives. °· Having a lot of life stress.   °· Having  marital conflict.   °· Lacking a social support network.   °· Having a baby with special needs.   °· Having health problems, such as diabetes.   °SIGNS AND SYMPTOMS °Symptoms of baby blues include: °· Brief changes in mood, such as going   from extreme happiness to sadness. °· Decreased concentration.   °· Difficulty sleeping.   °· Crying spells, tearfulness.   °· Irritability.   °· Anxiety.   °Symptoms of postpartum depression typically begin within the first month after giving birth. These symptoms include: °· Difficulty sleeping or excessive sleepiness.   °· Marked weight loss.   °· Agitation.   °· Feelings of worthlessness.   °· Lack of interest in activity or food.   °Postpartum psychosis is a very serious condition and can be dangerous. Fortunately, it is rare. Displaying any of the following symptoms is cause for immediate medical attention. Symptoms of postpartum psychosis include:  °· Hallucinations and delusions.   °· Bizarre or disorganized behavior.   °· Confusion or disorientation.   °DIAGNOSIS  °A diagnosis is made by an evaluation of your symptoms. There are no medical or lab tests that lead to a diagnosis, but there are various questionnaires that a health care provider may use to identify those with the baby blues, postpartum depression, or psychosis. Often, a screening tool called the Edinburgh Postnatal Depression Scale is used to diagnose depression in the postpartum period.  °TREATMENT °The baby blues usually goes away on its own in 1-2 weeks. Social support is often all that is needed. You will be encouraged to get adequate sleep and rest. Occasionally, you may be given medicines to help you sleep.  °Postpartum depression requires treatment because it can last several months or longer if it is not treated. Treatment may include individual or group therapy, medicine, or both to address any social, physiological, and psychological factors that may play a role in the depression. Regular exercise, a  healthy diet, rest, and social support may also be strongly recommended.  °Postpartum psychosis is more serious and needs treatment right away. Hospitalization is often needed. °HOME CARE INSTRUCTIONS °· Get as much rest as you can. Nap when the baby sleeps.   °· Exercise regularly. Some women find yoga and walking to be beneficial.   °· Eat a balanced and nourishing diet.   °· Do little things that you enjoy. Have a cup of tea, take a bubble bath, read your favorite magazine, or listen to your favorite music. °· Avoid alcohol.   °· Ask for help with household chores, cooking, grocery shopping, or running errands as needed. Do not try to do everything.   °· Talk to people close to you about how you are feeling. Get support from your partner, family members, friends, or other new moms. °· Try to stay positive in how you think. Think about the things you are grateful for.   °· Do not spend a lot of time alone.   °· Only take over-the-counter or prescription medicine as directed by your health care provider. °· Keep all your postpartum appointments.   °· Let your health care provider know if you have any concerns.   °SEEK MEDICAL CARE IF: °You are having a reaction to or problems with your medicine. °SEEK IMMEDIATE MEDICAL CARE IF: °· You have suicidal feelings.   °· You think you may harm the baby or someone else. °MAKE SURE YOU: °· Understand these instructions. °· Will watch your condition. °· Will get help right away if you are not doing well or get worse. °Document Released: 04/02/2004 Document Revised: 07/04/2013 Document Reviewed: 04/10/2013 °Exit[employer]® Patient Information ©2015 [employer]. This information is not intended to replace advice given to you by your health care provider. Make sure you discuss any questions you have with your health care provider. °Breastfeeding and Mastitis °Mastitis is inflammation of the breast tissue. It can occur in women who   are breastfeeding. This can make breastfeeding  painful. Mastitis will sometimes go away on its own. Your health care provider will help determine if treatment is needed. °CAUSES °Mastitis is often associated with a blocked milk (lactiferous) duct. This can happen when too much milk builds up in the breast. Causes of excess milk in the breast can include: °· Poor latch-on. If your baby is not latched onto the breast properly, she or he may not empty your breast completely while breastfeeding. °· Allowing too much time to pass between feedings. °· Wearing a bra or other clothing that is too tight. This puts extra pressure on the lactiferous ducts so milk does not flow through them as it should. °Mastitis can also be caused by a bacterial infection. Bacteria may enter the breast tissue through cuts or openings in the skin. In women who are breastfeeding, this may occur because of cracked or irritated skin. Cracks in the skin are often caused when your baby does not latch on properly to the breast. °SIGNS AND SYMPTOMS °· Swelling, redness, tenderness, and pain in an area of the breast. °· Swelling of the glands under the arm on the same side. °· Fever may or may not accompany mastitis. °If an infection is allowed to progress, a collection of pus (abscess) may develop. °DIAGNOSIS  °Your health care provider can usually diagnose mastitis based on your symptoms and a physical exam. Tests may be done to help confirm the diagnosis. These may include: °· Removal of pus from the breast by applying pressure to the area. This pus can be examined in the lab to determine which bacteria are present. If an abscess has developed, the fluid in the abscess can be removed with a needle. This can also be used to confirm the diagnosis and determine the bacteria present. In most cases, pus will not be present. °· Blood tests to determine if your body is fighting a bacterial infection. °· Mammogram or ultrasound tests to rule out other problems or diseases. °TREATMENT  °Mastitis that  occurs with breastfeeding will sometimes go away on its own. Your health care provider may choose to wait 24 hours after first seeing you to decide whether a prescription medicine is needed. If your symptoms are worse after 24 hours, your health care provider will likely prescribe an antibiotic medicine to treat the mastitis. He or she will determine which bacteria are most likely causing the infection and will then select an appropriate antibiotic medicine. This is sometimes changed based on the results of tests performed to identify the bacteria, or if there is no response to the antibiotic medicine selected. Antibiotic medicines are usually given by mouth. You may also be given medicine for pain. °HOME CARE INSTRUCTIONS °· Only take over-the-counter or prescription medicines for pain, fever, or discomfort as directed by your health care provider. °· If your health care provider prescribed an antibiotic medicine, take the medicine as directed. Make sure you finish it even if you start to feel better. °· Do not wear a tight or underwire bra. Wear a soft, supportive bra. °· Increase your fluid intake, especially if you have a fever. °· Continue to empty the breast. Your health care provider can tell you whether this milk is safe for your infant or needs to be thrown out. You may be told to stop nursing until your health care provider thinks it is safe for your baby. Use a breast pump if you are advised to stop nursing. °· Keep your nipples   clean and dry. °· Empty the first breast completely before going to the other breast. If your baby is not emptying your breasts completely for some reason, use a breast pump to empty your breasts. °· If you go back to work, pump your breasts while at work to stay in time with your nursing schedule. °· Avoid allowing your breasts to become overly filled with milk (engorged). °SEEK MEDICAL CARE IF: °· You have pus-like discharge from the breast. °· Your symptoms do not improve with  the treatment prescribed by your health care provider within 2 days. °SEEK IMMEDIATE MEDICAL CARE IF: °· Your pain and swelling are getting worse. °· You have pain that is not controlled with medicine. °· You have a red line extending from the breast toward your armpit. °· You have a fever or persistent symptoms for more than 2-3 days. °· You have a fever and your symptoms suddenly get worse. °MAKE SURE YOU:  °· Understand these instructions. °· Will watch your condition. °· Will get help right away if you are not doing well or get worse. °Document Released: 10/24/2004 Document Revised: 07/04/2013 Document Reviewed: 02/02/2013 °[employer]® Patient Information ©2015 [employer]. This information is not intended to replace advice given to you by your health care provider. Make sure you discuss any questions you have with your health care provider. °Breastfeeding °Deciding to breastfeed is one of the best choices you can make for you and your baby. A change in hormones during pregnancy causes your breast tissue to grow and increases the number and size of your milk ducts. These hormones also allow proteins, sugars, and fats from your blood supply to make breast milk in your milk-producing glands. Hormones prevent breast milk from being released before your baby is born as well as prompt milk flow after birth. Once breastfeeding has begun, thoughts of your baby, as well as his or her sucking or crying, can stimulate the release of milk from your milk-producing glands.  °BENEFITS OF BREASTFEEDING °For Your Baby °· Your first milk (colostrum) helps your baby's digestive system function better.   °· There are antibodies in your milk that help your baby fight off infections.   °· Your baby has a lower incidence of asthma, allergies, and sudden infant death syndrome.   °· The nutrients in breast milk are better for your baby than infant formulas and are designed uniquely for your baby's needs.   °· Breast milk improves your  baby's brain development.   °· Your baby is less likely to develop other conditions, such as childhood obesity, asthma, or type 2 diabetes mellitus.   °For You  °· Breastfeeding helps to create a very special bond between you and your baby.   °· Breastfeeding is convenient. Breast milk is always available at the correct temperature and costs nothing.   °· Breastfeeding helps to burn calories and helps you lose the weight gained during pregnancy.   °· Breastfeeding makes your uterus contract to its prepregnancy size faster and slows bleeding (lochia) after you give birth.   °· Breastfeeding helps to lower your risk of developing type 2 diabetes mellitus, osteoporosis, and breast or ovarian cancer later in life. °SIGNS THAT YOUR BABY IS HUNGRY °Early Signs of Hunger  °· Increased alertness or activity. °· Stretching. °· Movement of the head from side to side. °· Movement of the head and opening of the mouth when the corner of the mouth or cheek is stroked (rooting). °· Increased sucking sounds, smacking lips, cooing, sighing, or squeaking. °· Hand-to-mouth movements. °· Increased sucking of   fingers or hands. °Late Signs of Hunger °· Fussing. °· Intermittent crying. °Extreme Signs of Hunger °Signs of extreme hunger will require calming and consoling before your baby will be able to breastfeed successfully. Do not wait for the following signs of extreme hunger to occur before you initiate breastfeeding:   °· Restlessness. °· A loud, strong cry. °·  Screaming. °BREASTFEEDING BASICS °Breastfeeding Initiation °· Find a comfortable place to sit or lie down, with your neck and back well supported. °· Place a pillow or rolled up blanket under your baby to bring him or her to the level of your breast (if you are seated). Nursing pillows are specially designed to help support your arms and your baby while you breastfeed. °· Make sure that your baby's abdomen is facing your abdomen.   °· Gently massage your breast. With your  fingertips, massage from your chest wall toward your nipple in a circular motion. This encourages milk flow. You may need to continue this action during the feeding if your milk flows slowly. °· Support your breast with 4 fingers underneath and your thumb above your nipple. Make sure your fingers are well away from your nipple and your baby's mouth.   °· Stroke your baby's lips gently with your finger or nipple.   °· When your baby's mouth is open wide enough, quickly bring your baby to your breast, placing your entire nipple and as much of the colored area around your nipple (areola) as possible into your baby's mouth.   °¨ More areola should be visible above your baby's upper lip than below the lower lip.   °¨ Your baby's tongue should be between his or her lower gum and your breast.   °· Ensure that your baby's mouth is correctly positioned around your nipple (latched). Your baby's lips should create a seal on your breast and be turned out (everted). °· It is common for your baby to suck about 2-3 minutes in order to start the flow of breast milk. °Latching °Teaching your baby how to latch on to your breast properly is very important. An improper latch can cause nipple pain and decreased milk supply for you and poor weight gain in your baby. Also, if your baby is not latched onto your nipple properly, he or she may swallow some air during feeding. This can make your baby fussy. Burping your baby when you switch breasts during the feeding can help to get rid of the air. However, teaching your baby to latch on properly is still the best way to prevent fussiness from swallowing air while breastfeeding. °Signs that your baby has successfully latched on to your nipple:    °· Silent tugging or silent sucking, without causing you pain.   °· Swallowing heard between every 3-4 sucks.   °·  Muscle movement above and in front of his or her ears while sucking.   °Signs that your baby has not successfully latched on to  nipple:  °· Sucking sounds or smacking sounds from your baby while breastfeeding. °· Nipple pain. °If you think your baby has not latched on correctly, slip your finger into the corner of your baby's mouth to break the suction and place it between your baby's gums. Attempt breastfeeding initiation again. °Signs of Successful Breastfeeding °Signs from your baby:   °· A gradual decrease in the number of sucks or complete cessation of sucking.   °· Falling asleep.   °· Relaxation of his or her body.   °· Retention of a small amount of milk in his or her mouth.   °· Letting go   of your breast by himself or herself. °Signs from you: °· Breasts that have increased in firmness, weight, and size 1-3 hours after feeding.   °· Breasts that are softer immediately after breastfeeding. °· Increased milk volume, as well as a change in milk consistency and color by the fifth day of breastfeeding.   °· Nipples that are not sore, cracked, or bleeding. °Signs That Your Baby is Getting Enough Milk °· Wetting at least 3 diapers in a 24-hour period. The urine should be clear and pale yellow by age 5 days. °· At least 3 stools in a 24-hour period by age 5 days. The stool should be soft and yellow. °· At least 3 stools in a 24-hour period by age 7 days. The stool should be seedy and yellow. °· No loss of weight greater than 10% of birth weight during the first 3 days of age. °· Average weight gain of 4-7 ounces (113-198 g) per week after age 4 days. °· Consistent daily weight gain by age 5 days, without weight loss after the age of 2 weeks. °After a feeding, your baby may spit up a small amount. This is common. °BREASTFEEDING FREQUENCY AND DURATION °Frequent feeding will help you make more milk and can prevent sore nipples and breast engorgement. Breastfeed when you feel the need to reduce the fullness of your breasts or when your baby shows signs of hunger. This is called "breastfeeding on demand." Avoid introducing a pacifier to your  baby while you are working to establish breastfeeding (the first 4-6 weeks after your baby is born). After this time you may choose to use a pacifier. Research has shown that pacifier use during the first year of a baby's life decreases the risk of sudden infant death syndrome (SIDS). °Allow your baby to feed on each breast as long as he or she wants. Breastfeed until your baby is finished feeding. When your baby unlatches or falls asleep while feeding from the first breast, offer the second breast. Because newborns are often sleepy in the first few weeks of life, you may need to awaken your baby to get him or her to feed. °Breastfeeding times will vary from baby to baby. However, the following rules can serve as a guide to help you ensure that your baby is properly fed: °· Newborns (babies 4 weeks of age or younger) may breastfeed every 1-3 hours. °· Newborns should not go longer than 3 hours during the day or 5 hours during the night without breastfeeding. °· You should breastfeed your baby a minimum of 8 times in a 24-hour period until you begin to introduce solid foods to your baby at around 6 months of age. °BREAST MILK PUMPING °Pumping and storing breast milk allows you to ensure that your baby is exclusively fed your breast milk, even at times when you are unable to breastfeed. This is especially important if you are going back to work while you are still breastfeeding or when you are not able to be present during feedings. Your lactation consultant can give you guidelines on how long it is safe to store breast milk.  °A breast pump is a machine that allows you to pump milk from your breast into a sterile bottle. The pumped breast milk can then be stored in a refrigerator or freezer. Some breast pumps are operated by hand, while others use electricity. Ask your lactation consultant which type will work best for you. Breast pumps can be purchased, but some hospitals and breastfeeding support groups   lease  breast pumps on a monthly basis. A lactation consultant can teach you how to hand express breast milk, if you prefer not to use a pump.  °CARING FOR YOUR BREASTS WHILE YOU BREASTFEED °Nipples can become dry, cracked, and sore while breastfeeding. The following recommendations can help keep your breasts moisturized and healthy: °· Avoid using soap on your nipples.   °· Wear a supportive bra. Although not required, special nursing bras and tank tops are designed to allow access to your breasts for breastfeeding without taking off your entire bra or top. Avoid wearing underwire-style bras or extremely tight bras. °· Air dry your nipples for 3-4 minutes after each feeding.   °· Use only cotton bra pads to absorb leaked breast milk. Leaking of breast milk between feedings is normal.   °· Use lanolin on your nipples after breastfeeding. Lanolin helps to maintain your skin's normal moisture barrier. If you use pure lanolin, you do not need to wash it off before feeding your baby again. Pure lanolin is not toxic to your baby. You may also hand express a few drops of breast milk and gently massage that milk into your nipples and allow the milk to air dry. °In the first few weeks after giving birth, some women experience extremely full breasts (engorgement). Engorgement can make your breasts feel heavy, warm, and tender to the touch. Engorgement peaks within 3-5 days after you give birth. The following recommendations can help ease engorgement: °· Completely empty your breasts while breastfeeding or pumping. You may want to start by applying warm, moist heat (in the shower or with warm water-soaked hand towels) just before feeding or pumping. This increases circulation and helps the milk flow. If your baby does not completely empty your breasts while breastfeeding, pump any extra milk after he or she is finished. °· Wear a snug bra (nursing or regular) or tank top for 1-2 days to signal your body to slightly decrease milk  production. °· Apply ice packs to your breasts, unless this is too uncomfortable for you. °· Make sure that your baby is latched on and positioned properly while breastfeeding. °If engorgement persists after 48 hours of following these recommendations, contact your health care provider or a lactation consultant. °OVERALL HEALTH CARE RECOMMENDATIONS WHILE BREASTFEEDING °· Eat healthy foods. Alternate between meals and snacks, eating 3 of each per day. Because what you eat affects your breast milk, some of the foods may make your baby more irritable than usual. Avoid eating these foods if you are sure that they are negatively affecting your baby. °· Drink milk, fruit juice, and water to satisfy your thirst (about 10 glasses a day).   °· Rest often, relax, and continue to take your prenatal vitamins to prevent fatigue, stress, and anemia. °· Continue breast self-awareness checks. °· Avoid chewing and smoking tobacco. °· Avoid alcohol and drug use. °Some medicines that may be harmful to your baby can pass through breast milk. It is important to ask your health care provider before taking any medicine, including all over-the-counter and prescription medicine as well as vitamin and herbal supplements. °It is possible to become pregnant while breastfeeding. If birth control is desired, ask your health care provider about options that will be safe for your baby. °SEEK MEDICAL CARE IF:  °· You feel like you want to stop breastfeeding or have become frustrated with breastfeeding. °· You have painful breasts or nipples. °· Your nipples are cracked or bleeding. °· Your breasts are red, tender, or warm. °· You have   a swollen area on either breast. °· You have a fever or chills. °· You have nausea or vomiting. °· You have drainage other than breast milk from your nipples. °· Your breasts do not become full before feedings by the fifth day after you give birth. °· You feel sad and depressed. °· Your baby is too sleepy to eat  well. °· Your baby is having trouble sleeping.   °· Your baby is wetting less than 3 diapers in a 24-hour period. °· Your baby has less than 3 stools in a 24-hour period. °· Your baby's skin or the white part of his or her eyes becomes yellow.   °· Your baby is not gaining weight by 5 days of age. °SEEK IMMEDIATE MEDICAL CARE IF:  °· Your baby is overly tired (lethargic) and does not want to wake up and feed. °· Your baby develops an unexplained fever. °Document Released: 06/29/2005 Document Revised: 07/04/2013 Document Reviewed: 12/21/2012 °[employer]® Patient Information ©2015 [employer]. This information is not intended to replace advice given to you by your health care provider. Make sure you discuss any questions you have with your health care provider. ° °

## 2014-11-06 LAB — TYPE AND SCREEN
ABO/RH(D): B NEG
ANTIBODY SCREEN: POSITIVE
DAT, IGG: NEGATIVE
UNIT DIVISION: 0
Unit division: 0

## 2014-11-09 ENCOUNTER — Ambulatory Visit (HOSPITAL_COMMUNITY)
Admit: 2014-11-09 | Discharge: 2014-11-09 | Disposition: A | Discharge status: Home / Self Care | Payer: BLUE CROSS/BLUE SHIELD | Attending: Obstetrics | Admitting: Obstetrics

## 2014-11-09 NOTE — Lactation Note (Signed)
Lactation Consult  Mother's reason for visit:  Using NS Visit Type:  Feeding assessment Appointment Notes:  Sore nipple in hospital Consult:  Initial Lactation Consultant:  Audry RilesWeeks, Etan Vasudevan D  ________________________________________________________________________    ________________________________________________________________________  Mother's Name: Maceo ProLauren B. Koska Type of delivery:   Breastfeeding Experience:  P1  ________________________________________________________________________  Breastfeeding History (Post Discharge)  Frequency of breastfeeding:  q 2-3 hours Duration of feeding:  5-10 min then goes off to sleep  Supplementation   Breastmilk:  Volume 30-45 ml Frequency:  1-2 times/day   Pumping  Type of pump:  Symphony Frequency:  q 2-3 hours Volume:   2-4 oz  Infant Intake and Output Assessment  Voids:  10-12 in 24 hrs.  Color:  Clear yellow- had 2 voids while here for appointment Stools:  5-6 in 24 hrs.  Color:  Yellow- had stool while here for appointment  ________________________________________________________________________  Maternal Breast Assessment  Breast:  Filling Nipple:  Erect  _______________________________________________________________________ Feeding Assessment/Evaluation  Initial feeding assessment:  Positioning:  Cross cradle Right breast  LATCH documentation:  Latch:  2 = Grasps breast easily, tongue down, lips flanged, rhythmical sucking.- would not stay latched without NS- would only take a few sucks then come off the breast fussing  Audible swallowing:  2 = Spontaneous and intermittent  Type of nipple:  2 = Everted at rest and after stimulation  Comfort (Breast/Nipple):  1 = Filling, red/small blisters or bruises, mild/mod discomfort  Hold (Positioning):  1 = Assistance needed to correctly position infant at breast and maintain latch  LATCH score:  8  Attached assessment:  Deep  Lips flanged:  Yes.    Lips untucked:   No.  Suck assessment:  Nutritive  Tools:  Nipple shield 24 mm Instructed on use and cleaning of tool:  Yes.    Pre-feed weight:  2886 g  (6 lb. 5.8 oz.) Post-feed weight:  2912 g (6  lb. 6.7 oz.) Amount transferred:  26 ml Amount supplemented:0   ml  Additional Feeding Assessment -   I  Positioning:  Cross cradle Right breast  LATCH documentation:  Latch:  2 = Grasps breast easily, tongue down, lips flanged, rhythmical sucking.  Audible swallowing:  1 = A few with stimulation  Type of nipple:  2 = Everted at rest and after stimulation  Comfort (Breast/Nipple):  1 = Filling, red/small blisters or bruises, mild/mod discomfort  Hold (Positioning):  1 = Assistance needed to correctly position infant at breast and maintain latch  LATCH score:  7- again using NS  Attached assessment:  Deep  Lips flanged:  Yes.    Lips untucked:  No.  Suck assessment:  Nutritive  Tools:  Nipple shield 24 mm Instructed on use and cleaning of tool:  Yes.    Pre-feed weight:  2890 g  (6 lb. 6.0 oz.) diaper change to wake Maddy up Post-feed weight:  2922 g (6  lb. 7.0 oz.) Amount transferred:  32 ml Amount supplemented:  0 ml   Total amount pumped post feed:  R 2 oz    L 2 oz  Total amount transferred:  56 ml Total supplement given:  0 ml   Mom reports engorgement getting better. Pumping after every feeding for about 10 min to soften breasts. Mom reports breast feels softer after Maddy nursed but not empty. Lots of milk noted in NS when Maddy came off the breast  Has just gotten her own Medela pump and I assisted her in setup and use  of pump. Has been using rental Symphony. Mom reports breasts are softer after pumping. Encouraged to continue trying to latch Maddy without NS when she is not real fussy- to let her spend time at the bare breast when she is calm.  Praise given for mom's efforts. No questions at present,. Reviewed [facility] as resource for support. Mom declined another appointment- to call  if needs another appointment or with questions.

## 2015-11-12 DIAGNOSIS — K611 Rectal abscess: Secondary | ICD-10-CM | POA: Diagnosis not present

## 2015-11-13 ENCOUNTER — Encounter (HOSPITAL_COMMUNITY): Admission: AD | Disposition: A | Discharge status: Home / Self Care | Payer: Self-pay | Source: Ambulatory Visit

## 2015-11-13 ENCOUNTER — Observation Stay (HOSPITAL_COMMUNITY): Payer: BLUE CROSS/BLUE SHIELD | Admitting: Certified Registered Nurse Anesthetist

## 2015-11-13 ENCOUNTER — Other Ambulatory Visit: Payer: Self-pay | Admitting: Physician Assistant

## 2015-11-13 ENCOUNTER — Other Ambulatory Visit (INDEPENDENT_AMBULATORY_CARE_PROVIDER_SITE_OTHER): Payer: Self-pay | Admitting: Physician Assistant

## 2015-11-13 ENCOUNTER — Observation Stay (HOSPITAL_COMMUNITY)
Admission: AD | Admit: 2015-11-13 | Discharge: 2015-11-15 | Disposition: A | Discharge status: Home / Self Care | Payer: BLUE CROSS/BLUE SHIELD | Source: Ambulatory Visit | Attending: General Surgery | Admitting: General Surgery

## 2015-11-13 ENCOUNTER — Ambulatory Visit
Admission: RE | Admit: 2015-11-13 | Discharge: 2015-11-13 | Disposition: A | Discharge status: Home / Self Care | Payer: BLUE CROSS/BLUE SHIELD | Source: Ambulatory Visit | Attending: Physician Assistant | Admitting: Physician Assistant

## 2015-11-13 ENCOUNTER — Encounter (HOSPITAL_COMMUNITY): Payer: Self-pay

## 2015-11-13 DIAGNOSIS — Z8349 Family history of other endocrine, nutritional and metabolic diseases: Secondary | ICD-10-CM | POA: Insufficient documentation

## 2015-11-13 DIAGNOSIS — Z8249 Family history of ischemic heart disease and other diseases of the circulatory system: Secondary | ICD-10-CM | POA: Diagnosis not present

## 2015-11-13 DIAGNOSIS — L03317 Cellulitis of buttock: Secondary | ICD-10-CM

## 2015-11-13 DIAGNOSIS — J45909 Unspecified asthma, uncomplicated: Secondary | ICD-10-CM | POA: Insufficient documentation

## 2015-11-13 DIAGNOSIS — R1030 Lower abdominal pain, unspecified: Secondary | ICD-10-CM | POA: Diagnosis not present

## 2015-11-13 DIAGNOSIS — K611 Rectal abscess: Principal | ICD-10-CM | POA: Diagnosis present

## 2015-11-13 DIAGNOSIS — Z975 Presence of (intrauterine) contraceptive device: Secondary | ICD-10-CM | POA: Diagnosis not present

## 2015-11-13 DIAGNOSIS — F418 Other specified anxiety disorders: Secondary | ICD-10-CM | POA: Insufficient documentation

## 2015-11-13 HISTORY — PX: INCISION AND DRAINAGE PERIRECTAL ABSCESS: SHX1804

## 2015-11-13 LAB — CBC
HEMATOCRIT: 35.4 % — AB (ref 36.0–46.0)
Hemoglobin: 11.6 g/dL — ABNORMAL LOW (ref 12.0–15.0)
MCH: 28.3 pg (ref 26.0–34.0)
MCHC: 32.8 g/dL (ref 30.0–36.0)
MCV: 86.3 fL (ref 78.0–100.0)
PLATELETS: 216 10*3/uL (ref 150–400)
RBC: 4.1 MIL/uL (ref 3.87–5.11)
RDW: 13 % (ref 11.5–15.5)
WBC: 10.7 10*3/uL — AB (ref 4.0–10.5)

## 2015-11-13 SURGERY — INCISION AND DRAINAGE, ABSCESS, PERIRECTAL
Anesthesia: General | Laterality: Right

## 2015-11-13 MED ORDER — IOPAMIDOL (ISOVUE-300) INJECTION 61%
100.0000 mL | Freq: Once | INTRAVENOUS | Status: AC | PRN
  Administered 2015-11-13: 100 mL via INTRAVENOUS

## 2015-11-13 MED ORDER — HYDROMORPHONE HCL 1 MG/ML IJ SOLN
INTRAMUSCULAR | Status: AC
  Filled 2015-11-13: qty 1

## 2015-11-13 MED ORDER — ARTIFICIAL TEARS OP OINT
TOPICAL_OINTMENT | OPHTHALMIC | Status: AC
  Filled 2015-11-13: qty 3.5

## 2015-11-13 MED ORDER — ONDANSETRON HCL 4 MG/2ML IJ SOLN
INTRAMUSCULAR | Status: AC
  Filled 2015-11-13: qty 2

## 2015-11-13 MED ORDER — PROPOFOL 10 MG/ML IV BOLUS
INTRAVENOUS | Status: DC | PRN
  Administered 2015-11-13: 200 mg via INTRAVENOUS

## 2015-11-13 MED ORDER — ONDANSETRON HCL 4 MG/2ML IJ SOLN: 4.0000 mg | Freq: Four times a day (QID) | INTRAMUSCULAR | Status: DC | PRN

## 2015-11-13 MED ORDER — ALBUTEROL SULFATE (2.5 MG/3ML) 0.083% IN NEBU: 2.5000 mg | INHALATION_SOLUTION | Freq: Four times a day (QID) | RESPIRATORY_TRACT | Status: DC | PRN

## 2015-11-13 MED ORDER — LACTATED RINGERS IV SOLN
INTRAVENOUS | Status: DC | PRN
  Administered 2015-11-13: 21:00:00 via INTRAVENOUS

## 2015-11-13 MED ORDER — ACETAMINOPHEN-CODEINE #3 300-30 MG PO TABS
1.0000 | ORAL_TABLET | Freq: Four times a day (QID) | ORAL | Status: DC | PRN
  Administered 2015-11-15: 2 via ORAL
  Filled 2015-11-13: qty 2

## 2015-11-13 MED ORDER — SODIUM CHLORIDE 0.9% FLUSH: 10.0000 mL | INTRAVENOUS | Status: DC | PRN

## 2015-11-13 MED ORDER — 0.9 % SODIUM CHLORIDE (POUR BTL) OPTIME
TOPICAL | Status: DC | PRN
  Administered 2015-11-13: 1000 mL

## 2015-11-13 MED ORDER — MIDAZOLAM HCL 5 MG/5ML IJ SOLN
INTRAMUSCULAR | Status: DC | PRN
  Administered 2015-11-13: 2 mg via INTRAVENOUS

## 2015-11-13 MED ORDER — TRAMADOL HCL 50 MG PO TABS
100.0000 mg | ORAL_TABLET | Freq: Two times a day (BID) | ORAL | Status: DC | PRN
  Administered 2015-11-14: 100 mg via ORAL
  Filled 2015-11-13: qty 2

## 2015-11-13 MED ORDER — MEPERIDINE HCL 25 MG/ML IJ SOLN
6.2500 mg | INTRAMUSCULAR | Status: DC | PRN
  Administered 2015-11-13: 12.5 mg via INTRAVENOUS

## 2015-11-13 MED ORDER — IBUPROFEN 600 MG PO TABS: 600.0000 mg | ORAL_TABLET | Freq: Four times a day (QID) | ORAL | Status: DC | PRN

## 2015-11-13 MED ORDER — MEPERIDINE HCL 25 MG/ML IJ SOLN
INTRAMUSCULAR | Status: AC
  Filled 2015-11-13: qty 1

## 2015-11-13 MED ORDER — FENTANYL CITRATE (PF) 100 MCG/2ML IJ SOLN
INTRAMUSCULAR | Status: DC | PRN
  Administered 2015-11-13 (×2): 50 ug via INTRAVENOUS

## 2015-11-13 MED ORDER — HYDROMORPHONE HCL 1 MG/ML IJ SOLN
1.0000 mg | INTRAMUSCULAR | Status: DC | PRN
  Administered 2015-11-13: 1 mg via INTRAVENOUS
  Filled 2015-11-13: qty 1

## 2015-11-13 MED ORDER — LORATADINE 10 MG PO TABS
10.0000 mg | ORAL_TABLET | Freq: Every day | ORAL | Status: DC
  Filled 2015-11-13: qty 1

## 2015-11-13 MED ORDER — KCL IN DEXTROSE-NACL 20-5-0.45 MEQ/L-%-% IV SOLN
INTRAVENOUS | Status: DC
  Administered 2015-11-13: 19:00:00 via INTRAVENOUS
  Filled 2015-11-13: qty 1000

## 2015-11-13 MED ORDER — SUCCINYLCHOLINE CHLORIDE 20 MG/ML IJ SOLN
INTRAMUSCULAR | Status: DC | PRN
  Administered 2015-11-13: 120 mg via INTRAVENOUS

## 2015-11-13 MED ORDER — LIDOCAINE HCL (CARDIAC) 20 MG/ML IV SOLN
INTRAVENOUS | Status: DC | PRN
  Administered 2015-11-13: 100 mg via INTRAVENOUS

## 2015-11-13 MED ORDER — ACETAMINOPHEN 500 MG PO TABS: 1000.0000 mg | ORAL_TABLET | Freq: Four times a day (QID) | ORAL | Status: DC | PRN

## 2015-11-13 MED ORDER — FLUTICASONE PROPIONATE 50 MCG/ACT NA SUSP
1.0000 | Freq: Every day | NASAL | Status: DC
  Filled 2015-11-13: qty 16

## 2015-11-13 MED ORDER — HYDROMORPHONE HCL 1 MG/ML IJ SOLN
0.2500 mg | INTRAMUSCULAR | Status: DC | PRN
  Administered 2015-11-13: 0.5 mg via INTRAVENOUS

## 2015-11-13 MED ORDER — SUCCINYLCHOLINE CHLORIDE 200 MG/10ML IV SOSY
PREFILLED_SYRINGE | INTRAVENOUS | Status: AC
  Filled 2015-11-13: qty 10

## 2015-11-13 MED ORDER — ONDANSETRON HCL 4 MG/2ML IJ SOLN: 4.0000 mg | Freq: Once | INTRAMUSCULAR | Status: DC | PRN

## 2015-11-13 MED ORDER — MIDAZOLAM HCL 2 MG/2ML IJ SOLN
INTRAMUSCULAR | Status: AC
  Filled 2015-11-13: qty 2

## 2015-11-13 MED ORDER — ONDANSETRON 4 MG PO TBDP: 4.0000 mg | ORAL_TABLET | Freq: Four times a day (QID) | ORAL | Status: DC | PRN

## 2015-11-13 MED ORDER — FENTANYL CITRATE (PF) 250 MCG/5ML IJ SOLN
INTRAMUSCULAR | Status: AC
  Filled 2015-11-13: qty 5

## 2015-11-13 MED ORDER — KETOROLAC TROMETHAMINE 30 MG/ML IJ SOLN
30.0000 mg | Freq: Four times a day (QID) | INTRAMUSCULAR | Status: AC
  Administered 2015-11-14 (×4): 30 mg via INTRAVENOUS
  Filled 2015-11-13 (×4): qty 1

## 2015-11-13 MED ORDER — PIPERACILLIN-TAZOBACTAM 3.375 G IVPB
3.3750 g | Freq: Three times a day (TID) | INTRAVENOUS | Status: DC
  Administered 2015-11-13 – 2015-11-15 (×5): 3.375 g via INTRAVENOUS
  Filled 2015-11-13 (×8): qty 50

## 2015-11-13 SURGICAL SUPPLY — 34 items
CANISTER SUCTION 2500CC (MISCELLANEOUS) ×2 IMPLANT
CLEANER TIP ELECTROSURG 2X2 (MISCELLANEOUS) IMPLANT
COVER SURGICAL LIGHT HANDLE (MISCELLANEOUS) ×2 IMPLANT
DRAPE UTILITY XL STRL (DRAPES) ×2 IMPLANT
DRSG PAD ABDOMINAL 8X10 ST (GAUZE/BANDAGES/DRESSINGS) ×2 IMPLANT
ELECT REM PT RETURN 9FT ADLT (ELECTROSURGICAL) ×2
ELECTRODE REM PT RTRN 9FT ADLT (ELECTROSURGICAL) ×1 IMPLANT
GAUZE PACKING IODOFORM 1 (PACKING) IMPLANT
GAUZE PACKING IODOFORM 1X5 (MISCELLANEOUS) ×4 IMPLANT
GAUZE SPONGE 4X4 12PLY STRL (GAUZE/BANDAGES/DRESSINGS) ×2 IMPLANT
GAUZE SPONGE 4X4 16PLY XRAY LF (GAUZE/BANDAGES/DRESSINGS) ×2 IMPLANT
GLOVE BIO SURGEON STRL SZ 6 (GLOVE) ×2 IMPLANT
GLOVE BIOGEL PI IND STRL 6.5 (GLOVE) ×1 IMPLANT
GLOVE BIOGEL PI INDICATOR 6.5 (GLOVE) ×1
GOWN STRL REUS W/ TWL LRG LVL3 (GOWN DISPOSABLE) ×1 IMPLANT
GOWN STRL REUS W/TWL 2XL LVL3 (GOWN DISPOSABLE) ×2 IMPLANT
GOWN STRL REUS W/TWL LRG LVL3 (GOWN DISPOSABLE) ×1
KIT BASIN OR (CUSTOM PROCEDURE TRAY) ×2 IMPLANT
KIT ROOM TURNOVER OR (KITS) ×2 IMPLANT
NEEDLE 18GX1X1/2 (RX/OR ONLY) (NEEDLE) ×2 IMPLANT
NS IRRIG 1000ML POUR BTL (IV SOLUTION) ×2 IMPLANT
PACK LITHOTOMY IV (CUSTOM PROCEDURE TRAY) ×2 IMPLANT
PAD ABD 8X10 STRL (GAUZE/BANDAGES/DRESSINGS) ×2 IMPLANT
PAD ARMBOARD 7.5X6 YLW CONV (MISCELLANEOUS) ×4 IMPLANT
PENCIL BUTTON HOLSTER BLD 10FT (ELECTRODE) ×2 IMPLANT
SPONGE LAP 18X18 X RAY DECT (DISPOSABLE) ×2 IMPLANT
SWAB COLLECTION DEVICE MRSA (MISCELLANEOUS) ×2 IMPLANT
SYR 30ML SLIP (SYRINGE) ×2 IMPLANT
TOWEL OR 17X24 6PK STRL BLUE (TOWEL DISPOSABLE) ×2 IMPLANT
TOWEL OR 17X26 10 PK STRL BLUE (TOWEL DISPOSABLE) ×2 IMPLANT
TUBE ANAEROBIC SPECIMEN COL (MISCELLANEOUS) ×2 IMPLANT
TUBE CONNECTING 12X1/4 (SUCTIONS) ×2 IMPLANT
UNDERPAD 30X30 INCONTINENT (UNDERPADS AND DIAPERS) ×2 IMPLANT
YANKAUER SUCT BULB TIP NO VENT (SUCTIONS) ×2 IMPLANT

## 2015-11-13 NOTE — Op Note (Addendum)
Incision and Drainage large complex right perirectal abscess  Pre-operative Diagnosis: right perirectal abscess   Post-operative Diagnosis: same   Indications: Pt has had increasing soreness and swelling over the last 2-3 days on the right perirectal area.  She came to urgent office today and had erythema but no fluctuance.  Attempt to needle aspirate did not yield purulent material.  CT was performed and showed perirectal abscess.  She will be taken to OR for exploration, incision and drainage.    Anesthesia: General   Procedure Details  The procedure, risks and complications have been discussed in detail (including, infection, bleeding, need for additional procedures) with the patient, and the patient has signed consent to the procedure. The patient was informed that the wound would be left open.  The patient was taken to OR 2 and general anesthesia was induced. The patient was placed into lithotomy position.  The skin was sterilely prepped and draped over the affected area in the usual fashion. Time out was performed according to the surgical safety checklist.   A syringe and needle were used to help locate the abscess.  It was deep around 3-4 cm from anus on right around 9-10 o'clock.  A vertical incision was made approximally 3 cm in length.  I&D with a #11 blade was performed on the right perirectal region.  Cultures were sent. Additional skin was debrided around the opening. Purulent drainage was present. The cavity was explored and was very deep, going anterior, medial, and posterior.  Numerous septations were broken up digitally.  It DID NOT go around the midline in either direction.  A digital rectal exam was performed as well as with an anoscope.  A suspicious tag was seen, but no fistula was located.  A large lacrimal duct probe was used to very gently probe near the tag, but no tract was apparent. The wound was packed with 1 inch iodoform gauze loosely .  The patient was awakened from  anesthesia and taken to the PACU in stable condition.   Findings:  Large deep abscess cavity, but no evidence of fistula  EBL: min  Drains: None   Condition: Tolerated procedure well   Complications:  none known.

## 2015-11-13 NOTE — Anesthesia Preprocedure Evaluation (Signed)
Anesthesia Evaluation  Patient identified by MRN, date of birth, ID band Patient awake    Reviewed: Allergy & Precautions, NPO status , Patient's Chart, lab work & pertinent test results  Airway Mallampati: I  TM Distance: >3 FB Neck ROM: Full    Dental   Pulmonary asthma ,    Pulmonary exam normal        Cardiovascular Normal cardiovascular exam     Neuro/Psych Anxiety Depression    GI/Hepatic   Endo/Other    Renal/GU      Musculoskeletal   Abdominal   Peds  Hematology   Anesthesia Other Findings   Reproductive/Obstetrics                             Anesthesia Physical Anesthesia Plan  ASA: II and emergent  Anesthesia Plan: General   Post-op Pain Management:    Induction: Intravenous, Rapid sequence and Cricoid pressure planned  Airway Management Planned: Oral ETT  Additional Equipment:   Intra-op Plan:   Post-operative Plan: Extubation in OR  Informed Consent: I have reviewed the patients History and Physical, chart, labs and discussed the procedure including the risks, benefits and alternatives for the proposed anesthesia with the patient or authorized representative who has indicated his/her understanding and acceptance.     Plan Discussed with: CRNA and Surgeon  Anesthesia Plan Comments:         Anesthesia Quick Evaluation

## 2015-11-13 NOTE — H&P (Signed)
History of Present Illness Renee Matte Zayveon Raschke PA C; 11/13/2015 2:02 PM) The patient is a 30 year old female who presents with cellulitis. Renee Bryan is a 30 year old patient who is referred to [place] by Dr. Henrine Bryan with [facility] physicians at [facility] for a several day history of swelling and discomfort to the right buttock. The patient states that she noticed some fullness and some soreness there couple of weeks ago. She felt it more when she was sitting on the floor plate with her child. She said over the past couple of days the pain has become progressively worse. She presented to her primary care physician yesterday for evaluation. She was found to have cellulitis and was started on Augmentin. She was referred to [place] for further evaluation and treatment. The patient states that today she has noticed the redness has increased. The pain has increased as well despite the antibiotics. She denies measured fever but she says that she had sweats overnight suggesting a subjective fever.  The patient reports a previous history of perirectal abscess that was drained here in the office a few years ago.   Past Surgical History Renee Bryan, CMA; 11/13/2015 1:32 PM) Oral Surgery  Diagnostic Studies History Renee Bryan, CMA; 11/13/2015 1:32 PM) Colonoscopy >10 years ago Mammogram never Pap Smear 1-5 years ago  Allergies Renee Bryan, CMA; 11/13/2015 1:34 PM) Zoloft *ANTIDEPRESSANTS* Zithromax Z-Pak *MACROLIDES*  Medication History Renee Bryan, CMA; 11/13/2015 1:35 PM) ZyrTEC Allergy (  Capsule, Oral) Active. Flonase (50MCG/ACT Suspension, Nasal) Active. Augmentin (875-125MG  Tablet, Oral) Active. Probiotic (  Capsule, Oral) Active. Multivitamin Adult (Oral) Active. Medications Reconciled  Social History Renee Bryan, CMA; 11/13/2015 1:32 PM) Alcohol use Occasional alcohol use. Caffeine use Coffee. No drug use Tobacco use Never smoker.  Family History Renee Bryan, CMA; 11/13/2015 1:32 PM) Heart disease in female family member before age 80 Hypertension Mother. Thyroid problems Mother.  Pregnancy / Birth History Renee Bryan, CMA; 11/13/2015 1:32 PM) Age at menarche 9 years. Contraceptive History Intrauterine device, Oral contraceptives. Gravida 2 Irregular periods Maternal age 66-30 Para 1    Review of Systems Renee Bryan CMA; 11/13/2015 1:32 PM) General Present- Night Sweats. Not Present- Appetite Loss, Chills, Fatigue, Fever, Weight Gain and Weight Loss. Skin Present- New Lesions. Not Present- Change in Wart/Mole, Dryness, Hives, Jaundice, Non-Healing Wounds, Rash and Ulcer. HEENT Present- Seasonal Allergies and Wears glasses/contact lenses. Not Present- Earache, Hearing Loss, Hoarseness, Nose Bleed, Oral Ulcers, Ringing in the Ears, Sinus Pain, Sore Throat, Visual Disturbances and Yellow Eyes. Respiratory Not Present- Bloody sputum, Chronic Cough, Difficulty Breathing, Snoring and Wheezing. Breast Not Present- Breast Mass, Breast Pain, Nipple Discharge and Skin Changes. Cardiovascular Not Present- Chest Pain, Difficulty Breathing Lying Down, Leg Cramps, Palpitations, Rapid Heart Rate, Shortness of Breath and Swelling of Extremities. Gastrointestinal Present- Rectal Pain. Not Present- Abdominal Pain, Bloating, Bloody Stool, Change in Bowel Habits, Chronic diarrhea, Constipation, Difficulty Swallowing, Excessive gas, Gets full quickly at meals, Hemorrhoids, Indigestion, Nausea and Vomiting. Female Genitourinary Not Present- Frequency, Nocturia, Painful Urination, Pelvic Pain and Urgency. Musculoskeletal Not Present- Back Pain, Joint Pain, Joint Stiffness, Muscle Pain, Muscle Weakness and Swelling of Extremities. Neurological Present- Headaches. Not Present- Decreased Memory, Fainting, Numbness, Seizures, Tingling, Tremor, Trouble walking and Weakness. Psychiatric Not Present- Anxiety, Bipolar, Change in Sleep Pattern, Depression,  Fearful and Frequent crying. Endocrine Not Present- Cold Intolerance, Excessive Hunger, Hair Changes, Heat Intolerance, Hot flashes and New Diabetes. Hematology Not Present- Easy Bruising, Excessive bleeding, Gland problems, HIV and Persistent Infections.  Vitals Renee Bryan  CMA; 11/13/2015 1:35 PM) 11/13/2015 1:35 PM Weight: 181.6 lb Height: 63in Height was reported by patient. Body Surface Area: 1.86 m Body Mass Index: 32.17 kg/m  Temp.: 98.66F  Pulse: 105 (Regular)  BP: 114/80 (Sitting, Left Arm, Standard)       Physical Exam (Renee Schreiner PA C; 11/13/2015 1:59 PM) General Mental Status-Alert. General Appearance-Cooperative, Not in acute distress. Orientation-Oriented X4.  Integumentary General Characteristics Overall examination of the patient's skin reveals - no rashes. Color - normal coloration of skin. Skin Moisture - normal skin moisture.  Head and Neck Head-normocephalic, atraumatic with no lesions or palpable masses.  Chest and Lung Exam Chest and lung exam reveals -quiet, even and easy respiratory effort with no use of accessory muscles.  Rectal Note: There is an approximately 10 cm diameter area of mild erythema with more central tenderness to the right buttock just lateral to the anal verge. There is no fluctuance. There is no concentrated area of induration to suggest a superficial abscess. Her rectal exam shows no fluctuance or tenderness. She has good sphincter tone.   Neurologic Neurologic evaluation reveals -normal attention span and ability to concentrate and able to name objects and repeat phrases. Appropriate fund of knowledge .  Musculoskeletal Global Assessment Gait and Station - normal gait and station and normal posture.    Assessment & Plan (Renee Simic PA C; 11/13/2015 2:05 PM) CELLULITIS OF BUTTOCK, RIGHT (L03.317) Impression: The patient was examined with Dr. Rayburn Bryan. There is no clear-cut fluctuance or induration to  suggest abscess. She is definitely tender and there is a question of deep tissue infection versus abscess. After informed consent I prepped the area with ChloraPrep and instilled 1% lidocaine with epinephrine for local anesthesia in the center of the erythematous area. I used a 16-gauge needle to probe the deep tissues and found no collection of purulence or other fluid. A dry dressing was placed. As this was fruitless, the patient will need a CT scan of the pelvis to investigate this area. I will change her antibiotics to doxycycline and have her return in 2 days unless the CT scan gives some evidence of drainable abscess. If she worsens, she may need IV antibiotics. Current Plans Started Doxycycline Hyclate 100MG , 1 (one) Capsule two times daily, #28, 11/13/2015, No Refill. Return Friday for a wound check. Pt Education - [employer] Free Text Education/Instructions: discussed with patient and provided information. Signed electronically by Lenard ForthAndy Claxton Levitz, PA C (11/13/2015 2:06 PM)   Addendum: CT personally reviewed with Dr. Magnus IvanBlackman and radiology. There is a clear perirectal abscess that's rather deep and not amenable to drainage in the office. She will need admission to hospital for likely I&D in the operating room.

## 2015-11-13 NOTE — Anesthesia Procedure Notes (Signed)
Procedure Name: Intubation Date/Time: 11/13/2015 9:07 PM Performed by: Julianne RiceBILOTTA, Renee Brownley Z Pre-anesthesia Checklist: Patient identified, Timeout performed, Emergency Drugs available, Suction available and Patient being monitored Patient Re-evaluated:Patient Re-evaluated prior to inductionOxygen Delivery Method: Circle system utilized Preoxygenation: Pre-oxygenation with 100% oxygen Intubation Type: IV induction, Rapid sequence and Cricoid Pressure applied Laryngoscope Size: Mac and 3 Grade View: Grade I Tube type: Oral Tube size: 7.5 mm Number of attempts: 1 Airway Equipment and Method: Stylet Placement Confirmation: ETT inserted through vocal cords under direct vision,  breath sounds checked- equal and bilateral and positive ETCO2 Secured at: 22 cm Tube secured with: Tape Dental Injury: Teeth and Oropharynx as per pre-operative assessment

## 2015-11-13 NOTE — Anesthesia Postprocedure Evaluation (Signed)
Anesthesia Post Note  Patient: Renee Bryan  Procedure(s) Performed: Procedure(s) (LRB): IRRIGATION AND DEBRIDEMENT PERIRECTAL ABSCESS (Right)  Patient location during evaluation: PACU Anesthesia Type: General Level of consciousness: awake and alert Pain management: pain level controlled Vital Signs Assessment: post-procedure vital signs reviewed and stable Respiratory status: spontaneous breathing, nonlabored ventilation, respiratory function stable and patient connected to nasal cannula oxygen Cardiovascular status: blood pressure returned to baseline and stable Postop Assessment: no signs of nausea or vomiting Anesthetic complications: no    Last Vitals:  Filed Vitals:   11/13/15 2215 11/13/15 2255  BP: 104/62 110/66  Pulse: 96 94  Temp: 36.5 C 37 C  Resp: 13 18    Last Pain:  Filed Vitals:   11/13/15 2257  PainSc: 4                  Abdulai Blaylock DAVID

## 2015-11-13 NOTE — Transfer of Care (Signed)
Immediate Anesthesia Transfer of Care Note  Patient: Renee Bryan  Procedure(s) Performed: Procedure(s): IRRIGATION AND DEBRIDEMENT PERIRECTAL ABSCESS (Right)  Patient Location: PACU  Anesthesia Type:General  Level of Consciousness: awake and patient cooperative  Airway & Oxygen Therapy: Patient Spontanous Breathing  Post-op Assessment: Report given to RN and Post -op Vital signs reviewed and stable  Post vital signs: Reviewed and stable  Last Vitals:  Filed Vitals:   11/13/15 1828  BP: 128/74  Pulse: 88  Temp: 37.2 C  Resp: 16    Last Pain:  Filed Vitals:   11/13/15 2154  PainSc: 3          Complications: No apparent anesthesia complications

## 2015-11-14 ENCOUNTER — Encounter (HOSPITAL_COMMUNITY): Payer: Self-pay | Admitting: General Surgery

## 2015-11-14 DIAGNOSIS — Z8249 Family history of ischemic heart disease and other diseases of the circulatory system: Secondary | ICD-10-CM | POA: Diagnosis not present

## 2015-11-14 DIAGNOSIS — J45909 Unspecified asthma, uncomplicated: Secondary | ICD-10-CM | POA: Diagnosis not present

## 2015-11-14 DIAGNOSIS — K611 Rectal abscess: Secondary | ICD-10-CM | POA: Diagnosis not present

## 2015-11-14 DIAGNOSIS — Z8349 Family history of other endocrine, nutritional and metabolic diseases: Secondary | ICD-10-CM | POA: Diagnosis not present

## 2015-11-14 DIAGNOSIS — Z975 Presence of (intrauterine) contraceptive device: Secondary | ICD-10-CM | POA: Diagnosis not present

## 2015-11-14 DIAGNOSIS — F418 Other specified anxiety disorders: Secondary | ICD-10-CM | POA: Diagnosis not present

## 2015-11-14 LAB — SURGICAL PCR SCREEN
MRSA, PCR: NEGATIVE
Staphylococcus aureus: NEGATIVE

## 2015-11-14 LAB — CBC
HEMATOCRIT: 34.2 % — AB (ref 36.0–46.0)
Hemoglobin: 11 g/dL — ABNORMAL LOW (ref 12.0–15.0)
MCH: 28 pg (ref 26.0–34.0)
MCHC: 32.2 g/dL (ref 30.0–36.0)
MCV: 87 fL (ref 78.0–100.0)
Platelets: 237 10*3/uL (ref 150–400)
RBC: 3.93 MIL/uL (ref 3.87–5.11)
RDW: 12.5 % (ref 11.5–15.5)
WBC: 9.5 10*3/uL (ref 4.0–10.5)

## 2015-11-14 MED ORDER — SODIUM CHLORIDE 0.9% FLUSH
3.0000 mL | INTRAVENOUS | Status: DC | PRN
Start: 2015-11-14 — End: 2015-11-15

## 2015-11-14 MED ORDER — SODIUM CHLORIDE 0.9% FLUSH
3.0000 mL | Freq: Two times a day (BID) | INTRAVENOUS | Status: DC
  Administered 2015-11-14: 3 mL via INTRAVENOUS

## 2015-11-14 NOTE — Progress Notes (Signed)
Patient ID: Renee Bryan, female   DOB: [birth year], 30 y.o.   MRN: [id number]     [facility]      [address] [place]   [place], [place] [address]    Phone: [phone] FAX: [phone]     Subjective: Feels much better. Wbc normalized.  Afebrile.  VSS.   Objective:  Vital signs:  Filed Vitals:   11/13/15 2151 11/13/15 2200 11/13/15 2215 11/13/15 2255  BP: 99/64 99/46 104/62 110/66  Pulse: 106 100 96 94  Temp: 98.8 F (37.1 C)  97.7 F (36.5 C) 98.6 F (37 C)  TempSrc:    Oral  Resp: Height:      Weight:      SpO2: 98% 100% 99% 100%    Last BM Date: 11/12/15  Intake/Output   Yesterday:  05/03 0701 - 05/04 0700 In: 700 [I.V.:700] Out: 20 [Blood:20] This shift: I/O last 3 completed shifts: In: 700 [I.V.:700] Out: 20 [Blood:20]    Physical Exam: General: Pt awake/alert/oriented x4 in no acute distress Skin: dressing saturated with serous output.    Problem List:   Active Problems:   Perirectal abscess    Results:   Labs: Results for orders placed or performed during the hospital encounter of 11/13/15 (from the past 48 hour(s))  CBC     Status: Abnormal   Collection Time: 11/13/15  7:07 PM  Result Value Ref Range   WBC 10.7 (H) 4.0 - 10.5 K/uL   RBC 4.10 3.87 - 5.11 MIL/uL   Hemoglobin 11.6 (L) 12.0 - 15.0 g/dL   HCT 95.2 (L) 84.1 - 32.4 %   MCV 86.3 78.0 - 100.0 fL   MCH 28.3 26.0 - 34.0 pg   MCHC 32.8 30.0 - 36.0 g/dL   RDW 40.1 02.7 - 25.3 %   Platelets 216 150 - 400 K/uL  Surgical pcr screen     Status: None   Collection Time: 11/13/15  7:32 PM  Result Value Ref Range   MRSA, PCR NEGATIVE NEGATIVE   Staphylococcus aureus NEGATIVE NEGATIVE    Comment:        The Xpert SA Assay (FDA approved for NASAL specimens in patients over 55 years of age), is one component of a comprehensive surveillance program.  Test performance has been validated by [facility] for patients greater than  or equal to 31 year old. It is not intended to diagnose infection nor to guide or monitor treatment.   CBC     Status: Abnormal   Collection Time: 11/14/15  5:00 AM  Result Value Ref Range   WBC 9.5 4.0 - 10.5 K/uL   RBC 3.93 3.87 - 5.11 MIL/uL   Hemoglobin 11.0 (L) 12.0 - 15.0 g/dL   HCT 66.4 (L) 40.3 - 47.4 %   MCV 87.0 78.0 - 100.0 fL   MCH 28.0 26.0 - 34.0 pg   MCHC 32.2 30.0 - 36.0 g/dL   RDW 25.9 56.3 - 87.5 %   Platelets 237 150 - 400 K/uL    Imaging / Studies: Ct Pelvis W Contrast  11/13/2015  CLINICAL DATA:  Right buttock pain for several weeks, history of perirectal abscess EXAM: CT PELVIS WITH CONTRAST TECHNIQUE: Multidetector CT imaging of the pelvis was performed using the standard protocol following the bolus administration of intravenous contrast. CONTRAST:  ISOVUE-300 IOPAMIDOL (ISOVUE-300) INJECTION 61% COMPARISON:  None FINDINGS: Prone axial images of the pelvis submitted. Moderate stool noted in right  colon. There is no pericecal inflammation. Terminal ileum is unremarkable. The appendix is not identified. Some colonic stool noted in redundant mid transverse colon. There is moderate stool in distal sigmoid colon. Bilateral ovary is unremarkable. There is IUD within anteflexed uterus. Urinary bladder is under distended unremarkable. Axial image 77 there is a right perianal fluid collection extending inferolaterally in right medial buttock/gluteal region. Highly suspicious for abscess This is best seen in coronal image 65 measures at least 4.5 by 3 cm. There is mild thickening of anal wall suspicious for proctitis. Mild stranding of subcutaneous fat in right medial gluteal region and medial skin thickening as seen in axial image 76 suspicious for mild inflammatory changes or cellulitis. No air-fluid level is noted within abscess. No destructive bony lesions are noted. No acute fractures. No pelvic ascites or adenopathy. IMPRESSION: 1. There is confluent fluid collection in  right perianal region/right buttock posterior medially measures at least 4.5 x 3 cm highly suspicious for perianal abscess. Mild thickening of anal wall suspicious for inflammatory changes or proctitis. Mild stranding of subcutaneous fat and skin thickening in right gluteal region posterior medially suspicious for associated cellulitis. 2. Moderate stool noted in right colon. Moderate stool noted in distal sigmoid colon. No evidence of colitis or diverticulitis. 3. IUD in place.  Unremarkable ovaries.  Anteflexed uterus. 4. Unremarkable urinary bladder. These results were called by telephone at the time of interpretation on 11/13/2015 at 3:33 pm to Dr. Lenard ForthANDY LIEPINS , who verbally acknowledged these results. Electronically Signed   By: Natasha MeadLiviu  Pop M.D.   On: 11/13/2015 15:35    Medications / Allergies:  Scheduled Meds: . fluticasone  1 spray Each Nare Daily  . HYDROmorphone      . ketorolac  30 mg Intravenous Q6H  . loratadine  10 mg Oral Daily  . meperidine      . piperacillin-tazobactam (ZOSYN)  IV  3.375 g Intravenous Q8H   Continuous Infusions: . dextrose 5 % and 0.45 % NaCl with KCl 20 mEq/L 75 mL/hr at 11/13/15 1916   PRN Meds:.acetaminophen, acetaminophen-codeine, albuterol, ibuprofen, ondansetron **OR** ondansetron (ZOFRAN) IV, sodium chloride flush, traMADol  Antibiotics: Anti-infectives    Start     Dose/Rate Route Frequency Ordered Stop   11/13/15 1800  piperacillin-tazobactam (ZOSYN) IVPB 3.375 g     3.375 g 12.5 mL/hr over 240 Minutes Intravenous Every 8 hours 11/13/15 1740          Assessment/Plan POD#1 I&D perirectal abscess--Dr. Donell BeersByerly -plan for dressing change in AM with PO pain meds ID-zosyn D#1, follow cultures FEN-no issues, SLIV Dispo-anticipate DC in AM  Ashok NorrisEmina Angalina Ante, [facility] CenterNP-BC [facility] Pager [phone](7A-4:30P) For consults and floor pages call [phone](7A-4:30P)  11/14/2015 8:04 AM

## 2015-11-15 DIAGNOSIS — J45909 Unspecified asthma, uncomplicated: Secondary | ICD-10-CM | POA: Diagnosis not present

## 2015-11-15 DIAGNOSIS — K611 Rectal abscess: Secondary | ICD-10-CM | POA: Diagnosis not present

## 2015-11-15 DIAGNOSIS — Z8349 Family history of other endocrine, nutritional and metabolic diseases: Secondary | ICD-10-CM | POA: Diagnosis not present

## 2015-11-15 DIAGNOSIS — Z975 Presence of (intrauterine) contraceptive device: Secondary | ICD-10-CM | POA: Diagnosis not present

## 2015-11-15 DIAGNOSIS — F418 Other specified anxiety disorders: Secondary | ICD-10-CM | POA: Diagnosis not present

## 2015-11-15 DIAGNOSIS — Z8249 Family history of ischemic heart disease and other diseases of the circulatory system: Secondary | ICD-10-CM | POA: Diagnosis not present

## 2015-11-15 LAB — POCT I-STAT 4, (NA,K, GLUC, HGB,HCT)
Glucose, Bld: 89 mg/dL (ref 65–99)
HEMATOCRIT: 37 % (ref 36.0–46.0)
HEMOGLOBIN: 12.6 g/dL (ref 12.0–15.0)
Potassium: 3.4 mmol/L — ABNORMAL LOW (ref 3.5–5.1)
SODIUM: 141 mmol/L (ref 135–145)

## 2015-11-15 MED ORDER — DOCUSATE SODIUM 100 MG PO CAPS
100.0000 mg | ORAL_CAPSULE | Freq: Two times a day (BID) | ORAL | Status: AC
Start: 2015-11-15 — End: 2016-11-14

## 2015-11-15 MED ORDER — POLYETHYLENE GLYCOL 3350 17 G PO PACK: 17.0000 g | PACK | Freq: Every day | ORAL | Status: DC

## 2015-11-15 MED ORDER — OXYCODONE-ACETAMINOPHEN 5-325 MG PO TABS: 1.0000 | ORAL_TABLET | ORAL | Status: DC | PRN

## 2015-11-15 MED ORDER — ACETAMINOPHEN-CODEINE #3 300-30 MG PO TABS: 1.0000 | ORAL_TABLET | Freq: Four times a day (QID) | ORAL | Status: DC | PRN

## 2015-11-15 NOTE — Progress Notes (Signed)
Discharge home. Home discharge instruction given, Wound dressing showed and discussed with mother and husband, all questions answered.

## 2015-11-15 NOTE — Discharge Summary (Signed)
Physician Discharge Summary  Renee Bryan BJY:[id number] DOB: [birth year] DOA: 11/13/2015  PCP: Irving Copas, MD  Consultation: none  Admit date: 11/13/2015 Discharge date: 11/15/2015  Recommendations for Outpatient Follow-up:   Follow-up Information    Follow up with [facility], MD In 2 weeks.   Specialty:  General Surgery   Why:  For wound re-check   Contact information:   [address] [place] [address] [phone]      Discharge Diagnoses:  1. Perirectal abscess   Surgical Procedure: I&D perirectal abscess--Dr. Donell Beers  Discharge Condition: stable Disposition: home  Diet recommendation: regular  Filed Weights   11/13/15 1715  Weight: 107.502 kg (237 lb)       Hospital Course:  Renee Bryan is a 30 year old female admitted from our office with a perirectal abscess which required OR drainage.  She tolerated the procedure well and was transferred to the floor for IV antibiotics. On POD#2 she was able to tolerate dressing changes using tylenol #3.  The wound was clean and there was no surrounding induration or erythema.  She will complete her course of antibiotics which she already has for an additional 5 days.  We discussed warning signs that warrant further evaluation.  She was encouraged to call with questions or concerns.    Physical Exam: General appearance: alert and oriented. Calm and cooperative No acute distress. VSS. Afebrile.  Skin: wound is clean, tracks towards the anus.  Packed loosely. This was demonstrated for her mother.    Discharge Instructions     Medication List    STOP taking these medications        acetaminophen 500 MG tablet  Commonly known as:  TYLENOL      TAKE these medications        acetaminophen-codeine 300-30 MG tablet  Commonly known as:  TYLENOL #3  Take 1-2 tablets by mouth every 6 (six) hours as needed for moderate pain.     albuterol 108 (90 Base) MCG/ACT inhaler  Commonly known as:   PROVENTIL HFA;VENTOLIN HFA  Inhale 2 puffs into the lungs every 6 (six) hours as needed for shortness of breath.     cetirizine 10 MG tablet  Commonly known as:  ZYRTEC  Take 10 mg by mouth daily.     cholecalciferol 1000 units tablet  Commonly known as:  VITAMIN D  Take 1,000 Units by mouth daily.     docusate sodium 100 MG capsule  Commonly known as:  COLACE  Take 1 capsule (100 mg total) by mouth 2 (two) times daily.     ferrous sulfate 325 (65 FE) MG tablet  Take 1 tablet (325 mg total) by mouth 2 (two) times daily with a meal.     fluticasone 50 MCG/ACT nasal spray  Commonly known as:  FLONASE  Place 1 spray into both nostrils daily.     ibuprofen 600 MG tablet  Commonly known as:  ADVIL,MOTRIN  Take 1 tablet (600 mg total) by mouth every 6 (six) hours.     polyethylene glycol packet  Commonly known as:  MIRALAX / GLYCOLAX  Take 17 g by mouth daily.           Follow-up Information    Follow up with [facility], MD In 2 weeks.   Specialty:  General Surgery   Why:  For wound re-check   Contact information:   [address] [place] [address] [phone]  The results of significant diagnostics from this hospitalization (including imaging, microbiology, ancillary and laboratory) are listed below for reference.    Significant Diagnostic Studies: Ct Pelvis W Contrast  11/13/2015  CLINICAL DATA:  Right buttock pain for several weeks, history of perirectal abscess EXAM: CT PELVIS WITH CONTRAST TECHNIQUE: Multidetector CT imaging of the pelvis was performed using the standard protocol following the bolus administration of intravenous contrast. CONTRAST:  100mL ISOVUE-300 IOPAMIDOL (ISOVUE-300) INJECTION 61% COMPARISON:  None FINDINGS: Prone axial images of the pelvis submitted. Moderate stool noted in right colon. There is no pericecal inflammation. Terminal ileum is unremarkable. The appendix is not identified. Some colonic stool noted in redundant  mid transverse colon. There is moderate stool in distal sigmoid colon. Bilateral ovary is unremarkable. There is IUD within anteflexed uterus. Urinary bladder is under distended unremarkable. Axial image 77 there is a right perianal fluid collection extending inferolaterally in right medial buttock/gluteal region. Highly suspicious for abscess This is best seen in coronal image 65 measures at least 4.5 by 3 cm. There is mild thickening of anal wall suspicious for proctitis. Mild stranding of subcutaneous fat in right medial gluteal region and medial skin thickening as seen in axial image 76 suspicious for mild inflammatory changes or cellulitis. No air-fluid level is noted within abscess. No destructive bony lesions are noted. No acute fractures. No pelvic ascites or adenopathy. IMPRESSION: 1. There is confluent fluid collection in right perianal region/right buttock posterior medially measures at least 4.5 x 3 cm highly suspicious for perianal abscess. Mild thickening of anal wall suspicious for inflammatory changes or proctitis. Mild stranding of subcutaneous fat and skin thickening in right gluteal region posterior medially suspicious for associated cellulitis. 2. Moderate stool noted in right colon. Moderate stool noted in distal sigmoid colon. No evidence of colitis or diverticulitis. 3. IUD in place.  Unremarkable ovaries.  Anteflexed uterus. 4. Unremarkable urinary bladder. These results were called by telephone at the time of interpretation on 11/13/2015 at 3:33 pm to Dr. Lenard ForthANDY LIEPINS , who verbally acknowledged these results. Electronically Signed   By: Natasha MeadLiviu  Pop M.D.   On: 11/13/2015 15:35    Microbiology: Recent Results (from the past 240 hour(s))  Surgical pcr screen     Status: None   Collection Time: 11/13/15  7:32 PM  Result Value Ref Range Status   MRSA, PCR NEGATIVE NEGATIVE Final   Staphylococcus aureus NEGATIVE NEGATIVE Final    Comment:        The Xpert SA Assay (FDA approved for NASAL  specimens in patients over 721 years of age), is one component of a comprehensive surveillance program.  Test performance has been validated by [facility] for patients greater than or equal to 32 year old. It is not intended to diagnose infection nor to guide or monitor treatment.   Anaerobic culture     Status: None (Preliminary result)   Collection Time: 11/13/15  9:50 PM  Result Value Ref Range Status   Specimen Description ABSCESS  Final   Special Requests   Final    RIGHT PERI RECTAL PATIENT ON FOLLOWING ZOSYN AND AUGMENTIN   Gram Stain   Final    MODERATE WBC PRESENT,BOTH PMN AND MONONUCLEAR NO SQUAMOUS EPITHELIAL CELLS SEEN NO ORGANISMS SEEN Performed at [facility]    Culture PENDING  Incomplete   Report Status PENDING  Incomplete  Culture, routine-abscess     Status: None (Preliminary result)   Collection Time: 11/13/15  9:50 PM  Result Value  Ref Range Status   Specimen Description ABSCESS  Final   Special Requests   Final    RIGHT PERI RECTAL PATIENT ON FOLLOWING ZOSYN AND AUGMENTIN   Gram Stain   Final    MODERATE WBC PRESENT,BOTH PMN AND MONONUCLEAR NO SQUAMOUS EPITHELIAL CELLS SEEN NO ORGANISMS SEEN Performed at [facility]    Culture   Final    NO GROWTH 1 DAY Performed at [facility]    Report Status PENDING  Incomplete     Labs: Basic Metabolic Panel: No results for input(s): NA, K, CL, CO2, GLUCOSE, BUN, CREATININE, CALCIUM, MG, PHOS in the last 168 hours. Liver Function Tests: No results for input(s): AST, ALT, ALKPHOS, BILITOT, PROT, ALBUMIN in the last 168 hours. No results for input(s): LIPASE, AMYLASE in the last 168 hours. No results for input(s): AMMONIA in the last 168 hours. CBC:  Recent Labs Lab 11/13/15 1907 11/14/15 0500  WBC 10.7* 9.5  HGB 11.6* 11.0*  HCT 35.4* 34.2*  MCV 86.3 87.0  PLT 216 237   Cardiac Enzymes: No results for input(s): CKTOTAL, CKMB, CKMBINDEX, TROPONINI in the last 168  hours. BNP: BNP (last 3 results) No results for input(s): BNP in the last 8760 hours.  ProBNP (last 3 results) No results for input(s): PROBNP in the last 8760 hours.  CBG: No results for input(s): GLUCAP in the last 168 hours.  Active Problems:   Perirectal abscess   Time coordinating discharge: <30 mins   Signed:  Takari Duncombe, ANP-BC

## 2015-11-15 NOTE — Care Management Note (Signed)
Case Management Note  Patient Details  Name: Leotis ShamesLauren B. [name] MRN: [id number] Date of Birth: 11/21/1985  Subjective/Objective:                    Action/Plan: Face sheet information confirmed with patient . Patient's mother and husband are going to assist at home.   Expected Discharge Date:                  Expected Discharge Plan:  Home w Home Health Services  In-House Referral:     Discharge planning Services  CM Consult  Post Acute Care Choice:  Home Health Choice offered to:  Patient  DME Arranged:    DME Agency:     HH Arranged:  RN HH Agency:  [facility]  Status of Service:  Completed, signed off  Medicare Important Message Given:    Date Medicare IM Given:    Medicare IM give by:    Date Additional Medicare IM Given:    Additional Medicare Important Message give by:     If discussed at Long Length of Stay Meetings, dates discussed:    Additional Comments:  Kingsley PlanWile, Akilah Cureton Marie, RN 11/15/2015, 10:05 AM

## 2015-11-15 NOTE — Discharge Instructions (Signed)
WOUND CARE Take the doxycycline for a total of 5 days and then stop.  It can make you nauseated so be sure to take it with some food.   It is important that the wound be kept open.   -Keeping the skin edges apart will allow the wound to gradually heal from the base upwards.   - If the skin edges of the wound close too early, a new fluid pocket can form and infection can occur. -This is the reason to pack deeper wounds with gauze or ribbon -This is why drained wounds cannot be sewed closed right away  A healthy wound should form a lining of bright red "beefy" granulating tissue that will help shrink the wound and help the edges grow new skin into it.   -A little mucus / yellow discharge is normal (the body's natural way to try and form a scab) and should be gently washed off with soap and water with daily dressing changes.  -Green or foul smelling drainage implies bacterial colonization and can slow wound healing - a short course of antibiotic ointment (3-5 days) can help it clear up.  Call the doctor if it does not improve or worsens  -Avoid use of antibiotic ointments for more than a week as they can slow wound healing over time.    -Sometimes other wound care products will be used to reduce need for dressing changes and/or help clean up dirty wounds -Sometimes the surgeon needs to debride the wound in the office to remove dead or infected tissue out of the wound so it can heal more quickly and safely.    Change the dressing at least once a day -Wash the wound with mild soap and water gently every day.  It is good to shower or bathe the wound to help it clean out. -Use clean 4x4 gauze for medium/large wounds or ribbon plain NU-gauze for smaller wounds (it does not need to be sterile, just clean) -Keep the raw wound moist with a little saline or KY (saline) gel on the gauze.  -A dry wound will take longer to heal.  -Keep the skin dry around the wound to prevent breakdown and irritation. -Pack  the wound down to the base -The goal is to keep the skin apart, not overpack the wound -Use a Q-tip or blunt-tipped kabob stick toothpick to push the gauze down to the base in narrow or deep wounds   -Cover with a clean gauze and tape -paper or Medipore tape tend to be gentle on the skin -rotate the orientation of the tape to avoid repeated stress/trauma on the skin -using an ACE or Coban wrap on wounds on arms or legs can be used instead.  Complete all antibiotics through the entire prescription to help the infection heal and prevent new places of infection   Returning the see the surgeon is helpful to follow the healing process and help the wound close as fast as possible.

## 2015-11-16 DIAGNOSIS — Z4801 Encounter for change or removal of surgical wound dressing: Secondary | ICD-10-CM | POA: Diagnosis not present

## 2015-11-16 DIAGNOSIS — K611 Rectal abscess: Secondary | ICD-10-CM | POA: Diagnosis not present

## 2015-11-16 LAB — CULTURE, ROUTINE-ABSCESS: CULTURE: NO GROWTH

## 2015-11-19 DIAGNOSIS — K611 Rectal abscess: Secondary | ICD-10-CM | POA: Diagnosis not present

## 2015-11-19 DIAGNOSIS — Z4801 Encounter for change or removal of surgical wound dressing: Secondary | ICD-10-CM | POA: Diagnosis not present

## 2015-11-19 LAB — ANAEROBIC CULTURE

## 2015-11-22 DIAGNOSIS — K611 Rectal abscess: Secondary | ICD-10-CM | POA: Diagnosis not present

## 2015-11-22 DIAGNOSIS — Z4801 Encounter for change or removal of surgical wound dressing: Secondary | ICD-10-CM | POA: Diagnosis not present

## 2015-11-25 DIAGNOSIS — Z4801 Encounter for change or removal of surgical wound dressing: Secondary | ICD-10-CM | POA: Diagnosis not present

## 2015-11-25 DIAGNOSIS — K611 Rectal abscess: Secondary | ICD-10-CM | POA: Diagnosis not present

## 2016-03-10 DIAGNOSIS — J0141 Acute recurrent pansinusitis: Secondary | ICD-10-CM | POA: Diagnosis not present

## 2016-03-27 DIAGNOSIS — Z113 Encounter for screening for infections with a predominantly sexual mode of transmission: Secondary | ICD-10-CM | POA: Diagnosis not present

## 2016-03-27 DIAGNOSIS — R300 Dysuria: Secondary | ICD-10-CM | POA: Diagnosis not present

## 2016-03-27 DIAGNOSIS — R102 Pelvic and perineal pain: Secondary | ICD-10-CM | POA: Diagnosis not present

## 2016-03-27 DIAGNOSIS — Z01419 Encounter for gynecological examination (general) (routine) without abnormal findings: Secondary | ICD-10-CM | POA: Diagnosis not present

## 2016-03-27 DIAGNOSIS — L292 Pruritus vulvae: Secondary | ICD-10-CM | POA: Diagnosis not present

## 2016-03-27 DIAGNOSIS — Z23 Encounter for immunization: Secondary | ICD-10-CM | POA: Diagnosis not present

## 2016-04-13 DIAGNOSIS — R5383 Other fatigue: Secondary | ICD-10-CM | POA: Diagnosis not present

## 2016-04-13 DIAGNOSIS — R102 Pelvic and perineal pain: Secondary | ICD-10-CM | POA: Diagnosis not present

## 2016-09-07 DIAGNOSIS — F320 Major depressive disorder, single episode, mild: Secondary | ICD-10-CM | POA: Diagnosis not present

## 2016-10-01 DIAGNOSIS — K603 Anal fistula: Secondary | ICD-10-CM | POA: Diagnosis not present

## 2016-10-15 DIAGNOSIS — M545 Low back pain: Secondary | ICD-10-CM | POA: Diagnosis not present

## 2016-11-17 DIAGNOSIS — K603 Anal fistula: Secondary | ICD-10-CM | POA: Diagnosis not present

## 2016-12-04 IMAGING — CT CT PELVIS W/ CM
1 series · 15 of 32 positions shown, 19 images · IV contrast (APPLIED)
Comparison: None

CLINICAL DATA: Right buttock pain for several weeks, history of
perirectal abscess

EXAM:
CT PELVIS WITH CONTRAST
TECHNIQUE: Multidetector CT imaging of the pelvis was performed using the
standard protocol following the bolus administration of intravenous
contrast.
CONTRAST:  100mL CPRHZW-PFF IOPAMIDOL (CPRHZW-PFF) INJECTION 61%

[Series 3: routine pelvis w/cm · axial · 0.84mm/px · z∈[-392,-112]mm · 15 of 64 slices shown, 19 images]
[im 5/64  soft-tissue]
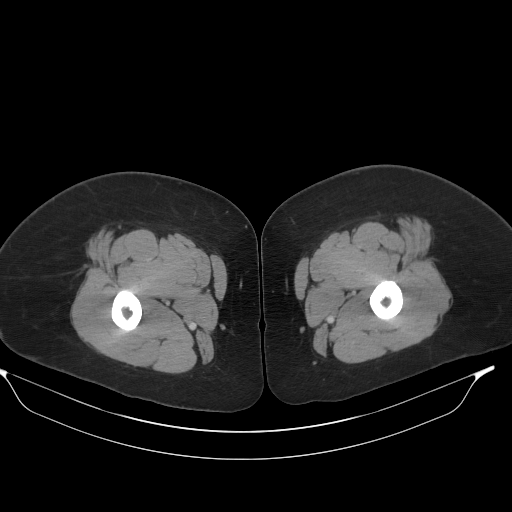
[im 5/64  bone]
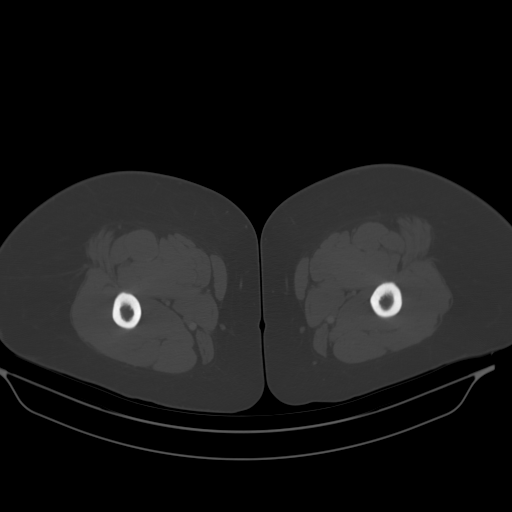
[im 9/64  soft-tissue]
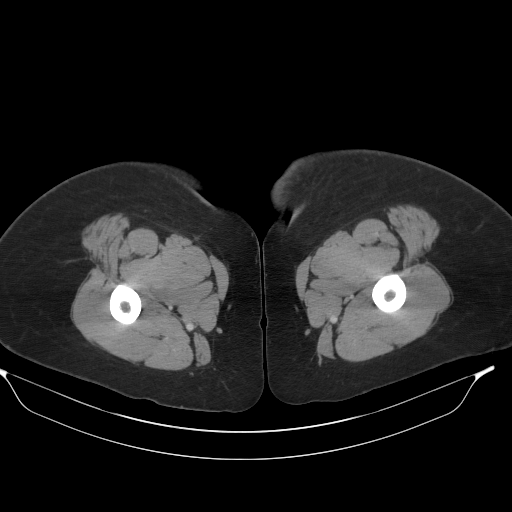
[im 13/64  soft-tissue]
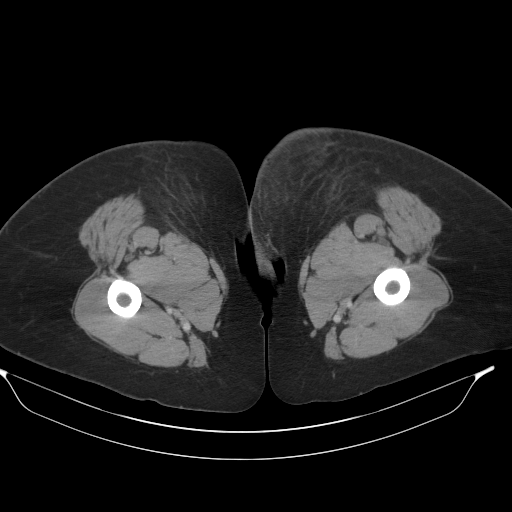
[im 19/64  soft-tissue]
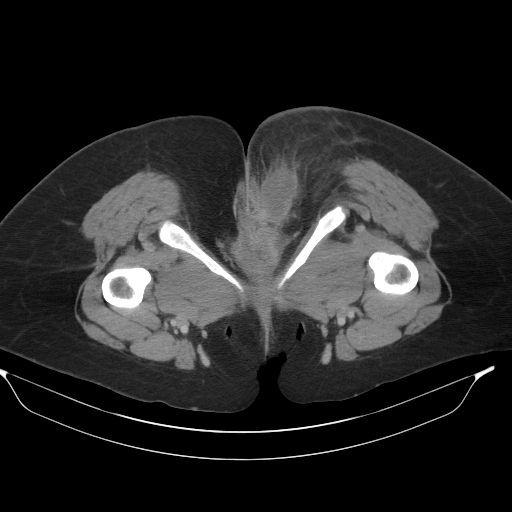
[im 23/64  soft-tissue]
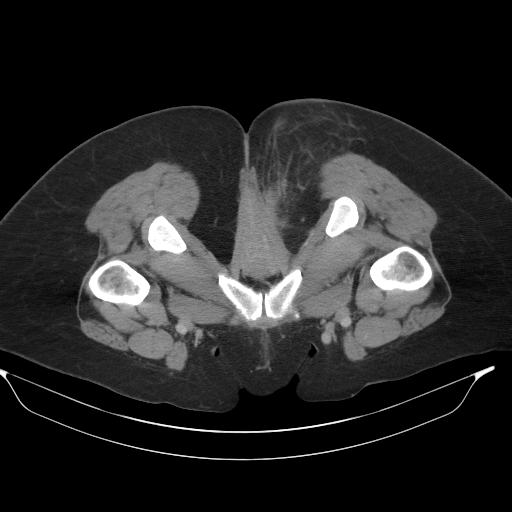
[im 27/64  soft-tissue]
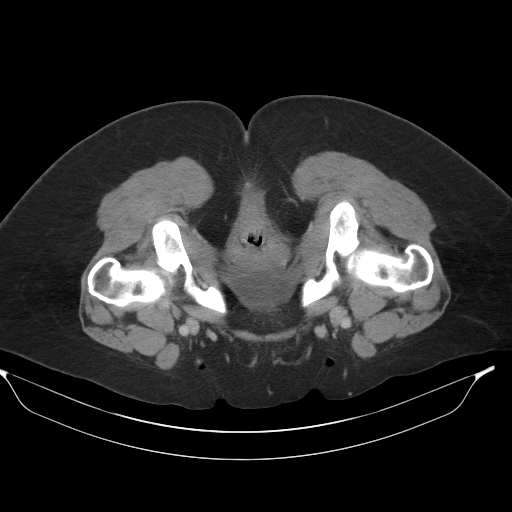
[im 33/64  soft-tissue]
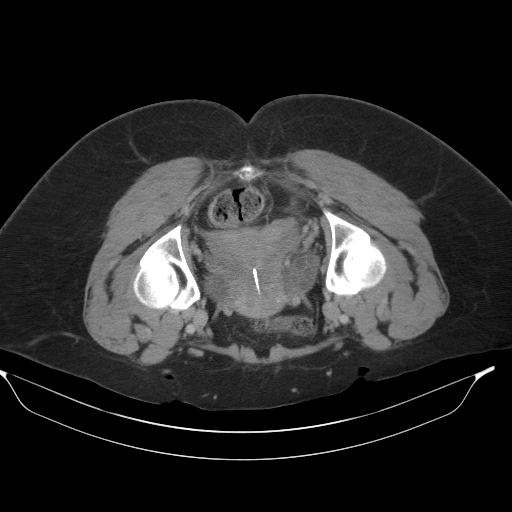
[im 37/64  soft-tissue]
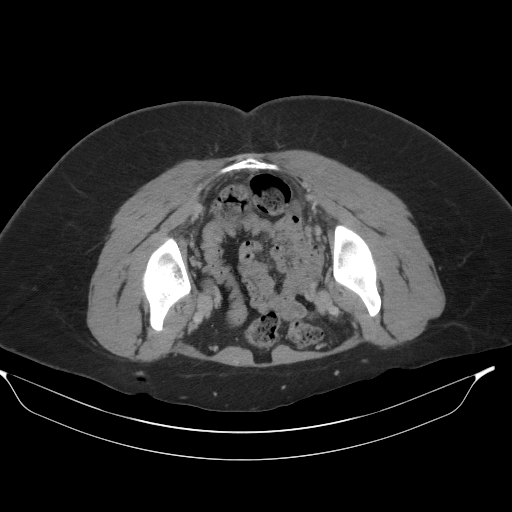
[im 41/64  soft-tissue]
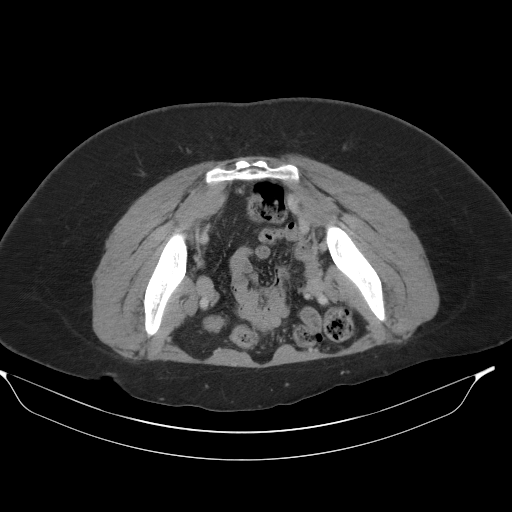
[im 41/64  bone]
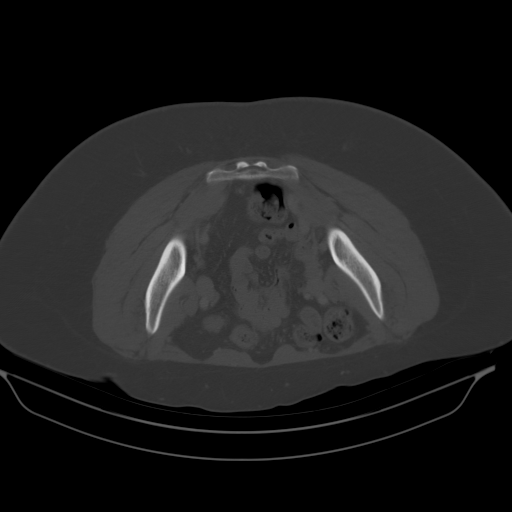
[im 45/64  soft-tissue]
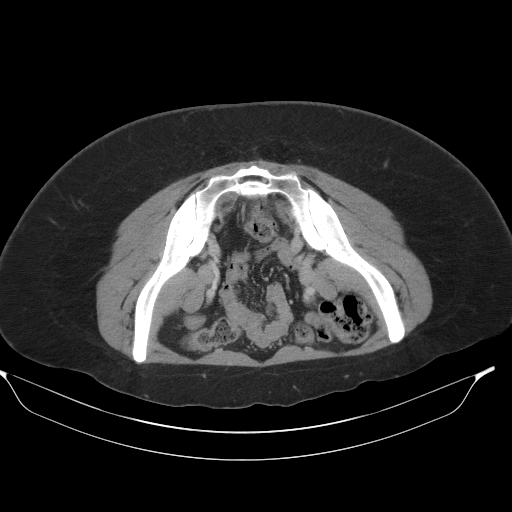
[im 51/64  soft-tissue]
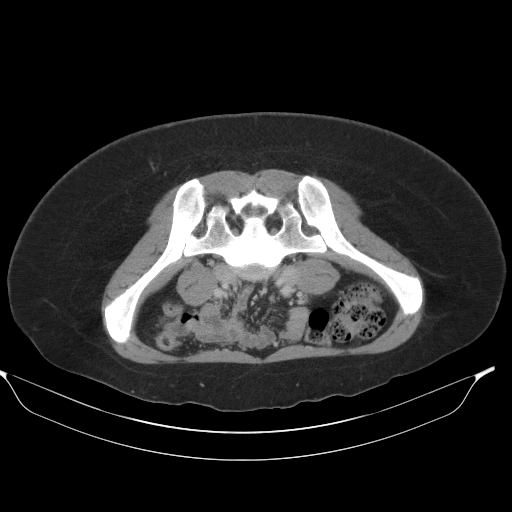
[im 55/64  soft-tissue]
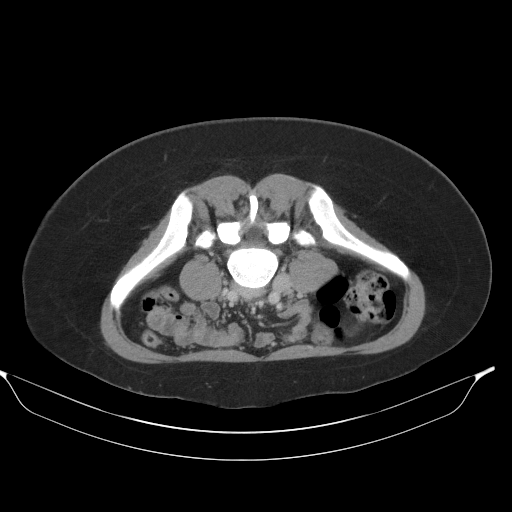
[im 55/64  lung]
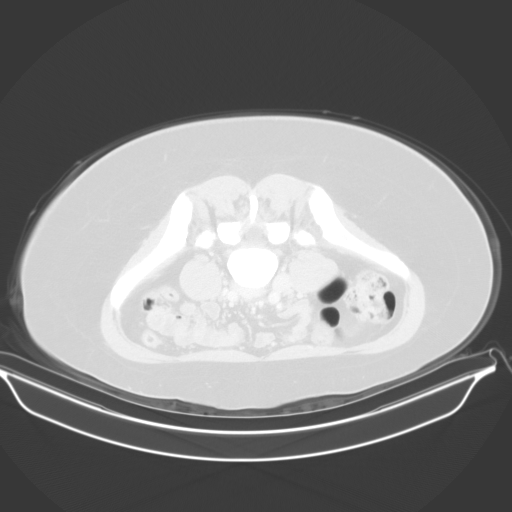
[im 57/64  lung]
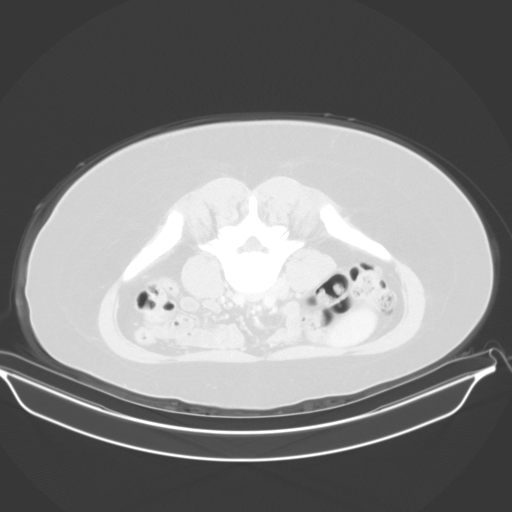
[im 59/64  soft-tissue]
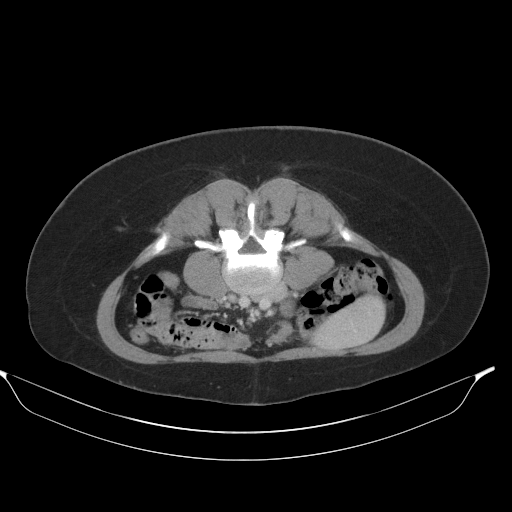
[im 59/64  lung]
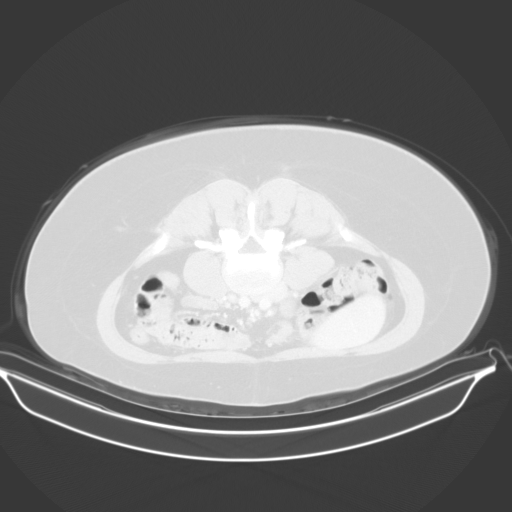
[im 61/64  lung]
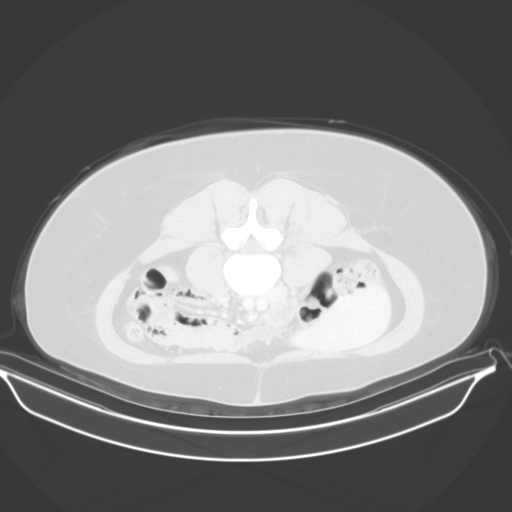

[15 of 32 positions shown; findings below may reference images not displayed]

FINDINGS: Prone axial images of the pelvis submitted. Moderate stool noted in
right colon. There is no pericecal inflammation. Terminal ileum is
unremarkable. The appendix is not identified. Some colonic stool
noted in redundant mid transverse colon. There is moderate stool in
distal sigmoid colon. Bilateral ovary is unremarkable. There is IUD
within anteflexed uterus. Urinary bladder is under distended
unremarkable. Axial image 77 there is a right perianal fluid
collection extending inferolaterally in right medial buttock/gluteal
region. Highly suspicious for abscess This is best seen in coronal
image 65 measures at least 4.5 by 3 cm. There is mild thickening of
anal wall suspicious for proctitis. Mild stranding of subcutaneous
fat in right medial gluteal region and medial skin thickening as
seen in axial image 76 suspicious for mild inflammatory changes or
cellulitis. No air-fluid level is noted within abscess.

No destructive bony lesions are noted. No acute fractures. No pelvic
ascites or adenopathy.
IMPRESSION: 1. There is confluent fluid collection in right perianal
region/right buttock posterior medially measures at least 4.5 x 3 cm
highly suspicious for perianal abscess. Mild thickening of anal wall
suspicious for inflammatory changes or proctitis. Mild stranding of
subcutaneous fat and skin thickening in right gluteal region
posterior medially suspicious for associated cellulitis.
2. Moderate stool noted in right colon. Moderate stool noted in
distal sigmoid colon. No evidence of colitis or diverticulitis.
3. IUD in place.  Unremarkable ovaries.  Anteflexed uterus.
4. Unremarkable urinary bladder.
These results were called by telephone at the time of interpretation
on 11/13/2015 at [DATE] to Dr. VYSNIU TROFIMOVAS , who verbally
acknowledged these results.

## 2017-02-22 ENCOUNTER — Other Ambulatory Visit: Payer: Self-pay | Admitting: General Surgery

## 2017-03-22 ENCOUNTER — Encounter (HOSPITAL_BASED_OUTPATIENT_CLINIC_OR_DEPARTMENT_OTHER): Payer: Self-pay | Admitting: *Deleted

## 2017-03-22 NOTE — Progress Notes (Signed)
NPO AFTER MN W/ EXCEPTION CLEAR LIQUIDS UNTIL 0600 (NO CREAM/ MILK PRODUCTS).  ARRIVE AT 1015.  NEEDS HG AND URINE PREG.  WILL TAKE AM MEDS W/ SIPS OF WATER DOS.

## 2017-03-24 NOTE — Progress Notes (Signed)
Called patient to be here at 0730 for surgery start time of 0930

## 2017-03-25 ENCOUNTER — Ambulatory Visit (HOSPITAL_BASED_OUTPATIENT_CLINIC_OR_DEPARTMENT_OTHER)
Admission: RE | Admit: 2017-03-25 | Discharge: 2017-03-25 | Disposition: A | Discharge status: Home / Self Care | Payer: BLUE CROSS/BLUE SHIELD | Source: Ambulatory Visit | Attending: General Surgery | Admitting: General Surgery

## 2017-03-25 ENCOUNTER — Ambulatory Visit (HOSPITAL_BASED_OUTPATIENT_CLINIC_OR_DEPARTMENT_OTHER): Payer: BLUE CROSS/BLUE SHIELD | Admitting: Anesthesiology

## 2017-03-25 ENCOUNTER — Encounter (HOSPITAL_BASED_OUTPATIENT_CLINIC_OR_DEPARTMENT_OTHER)
Admission: RE | Disposition: A | Discharge status: Home / Self Care | Payer: Self-pay | Source: Ambulatory Visit | Attending: General Surgery

## 2017-03-25 ENCOUNTER — Encounter (HOSPITAL_BASED_OUTPATIENT_CLINIC_OR_DEPARTMENT_OTHER): Payer: Self-pay | Admitting: *Deleted

## 2017-03-25 DIAGNOSIS — Z79899 Other long term (current) drug therapy: Secondary | ICD-10-CM | POA: Diagnosis not present

## 2017-03-25 DIAGNOSIS — F418 Other specified anxiety disorders: Secondary | ICD-10-CM | POA: Insufficient documentation

## 2017-03-25 DIAGNOSIS — J45909 Unspecified asthma, uncomplicated: Secondary | ICD-10-CM | POA: Insufficient documentation

## 2017-03-25 DIAGNOSIS — K610 Anal abscess: Secondary | ICD-10-CM | POA: Diagnosis not present

## 2017-03-25 DIAGNOSIS — K603 Anal fistula: Secondary | ICD-10-CM | POA: Insufficient documentation

## 2017-03-25 DIAGNOSIS — K589 Irritable bowel syndrome without diarrhea: Secondary | ICD-10-CM | POA: Diagnosis not present

## 2017-03-25 HISTORY — DX: Presence of spectacles and contact lenses: Z97.3

## 2017-03-25 HISTORY — DX: Personal history of other diseases of the digestive system: Z87.19

## 2017-03-25 HISTORY — DX: Irritable bowel syndrome, unspecified: K58.9

## 2017-03-25 HISTORY — DX: Other allergic rhinitis: J30.89

## 2017-03-25 HISTORY — DX: Unspecified asthma, uncomplicated: J45.909

## 2017-03-25 HISTORY — DX: Iron deficiency anemia, unspecified: D50.9

## 2017-03-25 HISTORY — DX: Other allergy status, other than to drugs and biological substances: Z91.09

## 2017-03-25 HISTORY — PX: PLACEMENT OF SETON: SHX6029

## 2017-03-25 HISTORY — PX: EVALUATION UNDER ANESTHESIA WITH ANAL FISSUROTOMY: SHX5622

## 2017-03-25 HISTORY — DX: Anal fistula: K60.3

## 2017-03-25 HISTORY — DX: Anal fistula, unspecified: K60.30

## 2017-03-25 HISTORY — DX: Other seasonal allergic rhinitis: J30.2

## 2017-03-25 LAB — POCT PREGNANCY, URINE: Preg Test, Ur: NEGATIVE

## 2017-03-25 LAB — POCT HEMOGLOBIN-HEMACUE: HEMOGLOBIN: 12.5 g/dL (ref 12.0–15.0)

## 2017-03-25 SURGERY — EXAM UNDER ANESTHESIA WITH ANAL FISSUROTOMY
Anesthesia: Monitor Anesthesia Care

## 2017-03-25 MED ORDER — HYDROGEN PEROXIDE 3 % EX SOLN
CUTANEOUS | Status: DC | PRN
  Administered 2017-03-25: 1

## 2017-03-25 MED ORDER — FENTANYL CITRATE (PF) 100 MCG/2ML IJ SOLN
INTRAMUSCULAR | Status: DC | PRN
Start: 2017-03-25 — End: 2017-03-25
  Administered 2017-03-25: 50 ug via INTRAVENOUS

## 2017-03-25 MED ORDER — SODIUM CHLORIDE 0.9 % IV SOLN
250.0000 mL | INTRAVENOUS | Status: DC | PRN
  Filled 2017-03-25: qty 250

## 2017-03-25 MED ORDER — LIDOCAINE 2% (20 MG/ML) 5 ML SYRINGE
INTRAMUSCULAR | Status: DC | PRN
  Administered 2017-03-25: 50 mg via INTRAVENOUS

## 2017-03-25 MED ORDER — KETOROLAC TROMETHAMINE 30 MG/ML IJ SOLN
INTRAMUSCULAR | Status: DC | PRN
  Administered 2017-03-25: 30 mg via INTRAVENOUS

## 2017-03-25 MED ORDER — FENTANYL CITRATE (PF) 100 MCG/2ML IJ SOLN
INTRAMUSCULAR | Status: AC
  Filled 2017-03-25: qty 2

## 2017-03-25 MED ORDER — SODIUM CHLORIDE 0.9% FLUSH
3.0000 mL | INTRAVENOUS | Status: DC | PRN
  Filled 2017-03-25: qty 3

## 2017-03-25 MED ORDER — ONDANSETRON HCL 4 MG/2ML IJ SOLN
4.0000 mg | Freq: Four times a day (QID) | INTRAMUSCULAR | Status: DC | PRN
  Filled 2017-03-25: qty 2

## 2017-03-25 MED ORDER — ACETAMINOPHEN 500 MG PO TABS
1000.0000 mg | ORAL_TABLET | ORAL | Status: AC
  Administered 2017-03-25: 1000 mg via ORAL
  Filled 2017-03-25: qty 2

## 2017-03-25 MED ORDER — ACETAMINOPHEN 325 MG PO TABS
650.0000 mg | ORAL_TABLET | ORAL | Status: DC | PRN
  Filled 2017-03-25: qty 2

## 2017-03-25 MED ORDER — ACETAMINOPHEN 500 MG PO TABS
ORAL_TABLET | ORAL | Status: AC
  Filled 2017-03-25: qty 2

## 2017-03-25 MED ORDER — ACETAMINOPHEN 650 MG RE SUPP
650.0000 mg | RECTAL | Status: DC | PRN
  Filled 2017-03-25: qty 1

## 2017-03-25 MED ORDER — OXYCODONE HCL 5 MG PO TABS
5.0000 mg | ORAL_TABLET | ORAL | Status: DC | PRN
  Filled 2017-03-25: qty 2

## 2017-03-25 MED ORDER — FENTANYL CITRATE (PF) 100 MCG/2ML IJ SOLN
25.0000 ug | INTRAMUSCULAR | Status: DC | PRN
  Administered 2017-03-25: 25 ug via INTRAVENOUS
  Filled 2017-03-25: qty 1

## 2017-03-25 MED ORDER — ONDANSETRON HCL 4 MG/2ML IJ SOLN
INTRAMUSCULAR | Status: DC | PRN
  Administered 2017-03-25: 4 mg via INTRAVENOUS

## 2017-03-25 MED ORDER — LACTATED RINGERS IV SOLN
INTRAVENOUS | Status: DC
  Administered 2017-03-25: 09:00:00 via INTRAVENOUS
  Filled 2017-03-25: qty 1000

## 2017-03-25 MED ORDER — LACTATED RINGERS IV SOLN
INTRAVENOUS | Status: DC | PRN
  Administered 2017-03-25: 09:00:00 via INTRAVENOUS

## 2017-03-25 MED ORDER — OXYCODONE HCL 5 MG PO TABS
5.0000 mg | ORAL_TABLET | Freq: Once | ORAL | Status: DC | PRN
  Filled 2017-03-25: qty 1

## 2017-03-25 MED ORDER — DEXAMETHASONE SODIUM PHOSPHATE 4 MG/ML IJ SOLN
INTRAMUSCULAR | Status: DC | PRN
  Administered 2017-03-25: 5 mg via INTRAVENOUS

## 2017-03-25 MED ORDER — PROPOFOL 500 MG/50ML IV EMUL
INTRAVENOUS | Status: DC | PRN
  Administered 2017-03-25: 200 ug/kg/min via INTRAVENOUS

## 2017-03-25 MED ORDER — SODIUM CHLORIDE 0.9% FLUSH
3.0000 mL | Freq: Two times a day (BID) | INTRAVENOUS | Status: DC
  Filled 2017-03-25: qty 3

## 2017-03-25 MED ORDER — MIDAZOLAM HCL 2 MG/2ML IJ SOLN
INTRAMUSCULAR | Status: AC
  Filled 2017-03-25: qty 2

## 2017-03-25 MED ORDER — OXYCODONE HCL 5 MG/5ML PO SOLN
5.0000 mg | Freq: Once | ORAL | Status: DC | PRN
  Filled 2017-03-25: qty 5

## 2017-03-25 MED ORDER — MIDAZOLAM HCL 5 MG/5ML IJ SOLN
INTRAMUSCULAR | Status: DC | PRN
  Administered 2017-03-25: 2 mg via INTRAVENOUS

## 2017-03-25 MED ORDER — HYDROCODONE-ACETAMINOPHEN 5-325 MG PO TABS: 1.0000 | ORAL_TABLET | Freq: Four times a day (QID) | ORAL | 0 refills | Status: AC | PRN

## 2017-03-25 MED ORDER — BUPIVACAINE-EPINEPHRINE 0.5% -1:200000 IJ SOLN
INTRAMUSCULAR | Status: DC | PRN
  Administered 2017-03-25: 20 mL

## 2017-03-25 SURGICAL SUPPLY — 54 items
BENZOIN TINCTURE PRP APPL 2/3 (GAUZE/BANDAGES/DRESSINGS) ×2 IMPLANT
BLADE HEX COATED 2.75 (ELECTRODE) ×2 IMPLANT
BLADE SURG 10 STRL SS (BLADE) IMPLANT
BLADE SURG 15 STRL LF DISP TIS (BLADE) ×1 IMPLANT
BLADE SURG 15 STRL SS (BLADE) ×1
BNDG GAUZE ELAST 4 BULKY (GAUZE/BANDAGES/DRESSINGS) ×2 IMPLANT
BRIEF STRETCH FOR OB PAD LRG (UNDERPADS AND DIAPERS) ×2 IMPLANT
CANISTER SUCT 3000ML PPV (MISCELLANEOUS) ×2 IMPLANT
COVER BACK TABLE 60X90IN (DRAPES) ×2 IMPLANT
COVER MAYO STAND STRL (DRAPES) ×2 IMPLANT
DECANTER SPIKE VIAL GLASS SM (MISCELLANEOUS) IMPLANT
DRAPE LAPAROTOMY 100X72 PEDS (DRAPES) ×2 IMPLANT
DRAPE UTILITY XL STRL (DRAPES) ×2 IMPLANT
ELECT BLADE 6.5 .24CM SHAFT (ELECTRODE) IMPLANT
ELECT REM PT RETURN 9FT ADLT (ELECTROSURGICAL) ×2
ELECTRODE REM PT RTRN 9FT ADLT (ELECTROSURGICAL) ×1 IMPLANT
GAUZE SPONGE 4X4 16PLY XRAY LF (GAUZE/BANDAGES/DRESSINGS) ×2 IMPLANT
GLOVE BIO SURGEON STRL SZ 6.5 (GLOVE) ×2 IMPLANT
GLOVE BIOGEL PI IND STRL 7.0 (GLOVE) ×1 IMPLANT
GLOVE BIOGEL PI INDICATOR 7.0 (GLOVE) ×1
GLOVE INDICATOR 7.0 STRL GRN (GLOVE) ×2 IMPLANT
GOWN SPEC L3 XXLG W/TWL (GOWN DISPOSABLE) ×2 IMPLANT
GOWN STRL REUS W/TWL 2XL LVL3 (GOWN DISPOSABLE) ×2 IMPLANT
HYDROGEN PEROXIDE 16OZ (MISCELLANEOUS) ×2 IMPLANT
KIT RM TURNOVER CYSTO AR (KITS) ×2 IMPLANT
LOOP VESSEL MAXI BLUE (MISCELLANEOUS) IMPLANT
NDL SAFETY ECLIPSE 18X1.5 (NEEDLE) IMPLANT
NEEDLE HYPO 18GX1.5 SHARP (NEEDLE)
NEEDLE HYPO 22GX1.5 SAFETY (NEEDLE) ×2 IMPLANT
NS IRRIG 500ML POUR BTL (IV SOLUTION) ×2 IMPLANT
PACK BASIN DAY SURGERY FS (CUSTOM PROCEDURE TRAY) ×2 IMPLANT
PAD ABD 8X10 STRL (GAUZE/BANDAGES/DRESSINGS) ×2 IMPLANT
PAD ARMBOARD 7.5X6 YLW CONV (MISCELLANEOUS) ×2 IMPLANT
PENCIL BUTTON HOLSTER BLD 10FT (ELECTRODE) ×2 IMPLANT
SPONGE GAUZE 4X4 12PLY STER LF (GAUZE/BANDAGES/DRESSINGS) ×2 IMPLANT
SPONGE HEMORRHOID 8X3CM (HEMOSTASIS) IMPLANT
SPONGE SURGIFOAM ABS GEL 12-7 (HEMOSTASIS) IMPLANT
SUCTION FRAZIER HANDLE 10FR (MISCELLANEOUS)
SUCTION TUBE FRAZIER 10FR DISP (MISCELLANEOUS) IMPLANT
SUT CHROMIC 2 0 SH (SUTURE) IMPLANT
SUT CHROMIC 3 0 SH 27 (SUTURE) IMPLANT
SUT ETHIBOND 0 (SUTURE) IMPLANT
SUT SILK 2 0 (SUTURE)
SUT SILK 2-0 18XBRD TIE 12 (SUTURE) IMPLANT
SUT VIC AB 2-0 SH 27 (SUTURE)
SUT VIC AB 2-0 SH 27XBRD (SUTURE) IMPLANT
SUT VIC AB 3-0 SH 18 (SUTURE) IMPLANT
SUT VIC AB 4-0 P-3 18XBRD (SUTURE) IMPLANT
SUT VIC AB 4-0 P3 18 (SUTURE)
SYR CONTROL 10ML LL (SYRINGE) ×2 IMPLANT
TOWEL OR 17X24 6PK STRL BLUE (TOWEL DISPOSABLE) ×2 IMPLANT
TRAY DSU PREP LF (CUSTOM PROCEDURE TRAY) ×2 IMPLANT
TUBE CONNECTING 12X1/4 (SUCTIONS) IMPLANT
YANKAUER SUCT BULB TIP NO VENT (SUCTIONS) ×2 IMPLANT

## 2017-03-25 NOTE — Transfer of Care (Signed)
Immediate Anesthesia Transfer of Care Note  Patient: Renee Bryan  Procedure(s) Performed: Procedure(s): EXAM UNDER ANESTHESIA (N/A) PLACEMENT OF SETON (N/A)  Patient Location: PACU  Anesthesia Type:MAC  Level of Consciousness: awake, alert , oriented and patient cooperative  Airway & Oxygen Therapy: Patient Spontanous Breathing and Patient connected to nasal cannula oxygen  Post-op Assessment: Report given to RN and Post -op Vital signs reviewed and stable  Post vital signs: Reviewed and stable  Last Vitals:  Vitals:   03/25/17 0808  BP: 132/72  Pulse: 67  Resp: 16  Temp: 36.8 C  SpO2: 99%    Last Pain:  Vitals:   03/25/17 0808  TempSrc: Oral      Patients Stated Pain Goal: 4 (03/54/65 6812)  Complications: No apparent anesthesia complications

## 2017-03-25 NOTE — Anesthesia Procedure Notes (Signed)
Procedure Name: MAC Date/Time: 03/25/2017 9:31 AM Performed by: Wanita Chamberlain Pre-anesthesia Checklist: Patient identified, Emergency Drugs available, Suction available, Patient being monitored and Timeout performed Patient Re-evaluated:Patient Re-evaluated prior to induction Oxygen Delivery Method: Nasal cannula Induction Type: IV induction

## 2017-03-25 NOTE — Discharge Instructions (Addendum)
Beginning the day after surgery:  You may sit in a tub of warm water 2-3 times a day to relieve discomfort.  Eat a regular diet high in fiber.  Avoid foods that give you constipation or diarrhea.  Avoid foods that are difficult to digest, such as seeds, nuts, corn or popcorn.  Do not go any longer than 2 days without a bowel movement.  You may take a dose of Milk of Magnesia if you become constipated.    Drink 6-8 glasses of water daily.  Walking is encouraged.  Avoid strenuous activity and heavy lifting for one month after surgery.    Call the office if you have any questions or concerns.  Call immediately if you develop:   Excessive rectal bleeding (more than a cup or passing large clots)  Increased discomfort  Fever greater than 100 F  Difficulty urinating  Do Not take any nonsteroidal anti inflammatories until after 3:45 pm today.   Post Anesthesia Home Care Instructions  Activity: Get plenty of rest for the remainder of the day. A responsible individual must stay with you for 24 hours following the procedure.  For the next 24 hours, DO NOT: -Drive a car -Advertising copywriterperate machinery -Drink alcoholic beverages -Take any medication unless instructed by your physician -Make any legal decisions or sign important papers.  Meals: Start with liquid foods such as gelatin or soup. Progress to regular foods as tolerated. Avoid greasy, spicy, heavy foods. If nausea and/or vomiting occur, drink only clear liquids until the nausea and/or vomiting subsides. Call your physician if vomiting continues.  Special Instructions/Symptoms: Your throat may feel dry or sore from the anesthesia or the breathing tube placed in your throat during surgery. If this causes discomfort, gargle with warm salt water. The discomfort should disappear within 24 hours.  If you had a scopolamine patch placed behind your ear for the management of post- operative nausea and/or vomiting:  1. The medication in the patch  is effective for 72 hours, after which it should be removed.  Wrap patch in a tissue and discard in the trash. Wash hands thoroughly with soap and water. 2. You may remove the patch earlier than 72 hours if you experience unpleasant side effects which may include dry mouth, dizziness or visual disturbances. 3. Avoid touching the patch. Wash your hands with soap and water after contact with the patch.

## 2017-03-25 NOTE — Anesthesia Preprocedure Evaluation (Addendum)
Anesthesia Evaluation  Patient identified by MRN, date of birth, ID band Patient awake    Reviewed: Allergy & Precautions, H&P , NPO status , Patient's Chart, lab work & pertinent test results  Airway Mallampati: II  TM Distance: >3 FB Neck ROM: full    Dental  (+) Teeth Intact, Dental Advisory Given   Pulmonary asthma ,    breath sounds clear to auscultation       Cardiovascular negative cardio ROS   Rhythm:regular Rate:Normal     Neuro/Psych PSYCHIATRIC DISORDERS Anxiety Depression    GI/Hepatic   Endo/Other    Renal/GU      Musculoskeletal   Abdominal   Peds  Hematology   Anesthesia Other Findings   Reproductive/Obstetrics                            Anesthesia Physical Anesthesia Plan  ASA: II  Anesthesia Plan: MAC   Post-op Pain Management:    Induction: Intravenous  PONV Risk Score and Plan: 2 and Ondansetron, Dexamethasone, Propofol infusion and Treatment may vary due to age or medical condition  Airway Management Planned: Simple Face Mask  Additional Equipment:   Intra-op Plan:   Post-operative Plan:   Informed Consent: I have reviewed the patients History and Physical, chart, labs and discussed the procedure including the risks, benefits and alternatives for the proposed anesthesia with the patient or authorized representative who has indicated his/her understanding and acceptance.     Plan Discussed with: CRNA, Anesthesiologist and Surgeon  Anesthesia Plan Comments:         Anesthesia Quick Evaluation

## 2017-03-25 NOTE — Anesthesia Postprocedure Evaluation (Signed)
Anesthesia Post Note  Patient: Khaliya B. Kooi  Procedure(s) Performed: Procedure(s) (LRB): EXAM UNDER ANESTHESIA (N/A) PLACEMENT OF SETON (N/A)     Patient location during evaluation: PACU Anesthesia Type: MAC Level of consciousness: awake and alert Pain management: pain level controlled Vital Signs Assessment: post-procedure vital signs reviewed and stable Respiratory status: spontaneous breathing, nonlabored ventilation, respiratory function stable and patient connected to nasal cannula oxygen Cardiovascular status: stable and blood pressure returned to baseline Postop Assessment: no signs of nausea or vomiting Anesthetic complications: no    Last Vitals:  Vitals:   03/25/17 1013 03/25/17 1015  BP:  (!) 111/59  Pulse: (!) 57 (!) 53  Resp: 11 17  Temp:    SpO2: 100% 100%    Last Pain:  Vitals:   03/25/17 1013  TempSrc:   PainSc: [place]

## 2017-03-25 NOTE — H&P (Signed)
The patient is a 31 year old female who presents with a complaint of anal problems. [name] had a right perirectal abscess drained in our office around 2015. She represented in May 2017 with recurrent abscess. This time she was taken to the operating room by Dr. Donell BeersByerly and underwent incision and drainage of right perirectal abscess on 13 Nov 2015. She did well until about 8 months ago when she noticed some drainage in her right buttocks just lateral to the anus. This will flareup every 3 or 4 weeks and drain and then get better. She would put some Neosporin on the wound when it drained. It gets sore when she walks for long periods of time.  She was told that she had a redundant colon when she had a colonoscopy in sixth-grade for chronic constipation. She says she has irritable bowel syndrome and has some GI issues particularly under stress. She's had no chronic diarrhea, nor any history of Crohn's disease.  Past Medical History:  Diagnosis Date  . Anal fistula   . Environmental allergies   . History of rectal abscess    PERIRECTAL 05/ 2017  S/P I&D  . IBS (irritable bowel syndrome)   . Iron deficiency anemia   . Mild asthma   . Seasonal and perennial allergic rhinitis   . Wears glasses    Past Surgical History:  Procedure Laterality Date  . COLONOSCOPY  1996  approx.  . INCISION AND DRAINAGE PERIRECTAL ABSCESS Right 11/13/2015   Procedure: IRRIGATION AND DEBRIDEMENT PERIRECTAL ABSCESS;  Surgeon: Almond LintFaera Byerly, MD;  Location: [facility] OR;  Service: General;  Laterality: Right;   Review of Systems - General ROS: negative for - chills, fatigue or fever Respiratory ROS: no cough, shortness of breath, or wheezing Cardiovascular ROS: no chest pain or dyspnea on exertion Gastrointestinal ROS: no abdominal pain, change in bowel habits, or black or bloody stools   Social History   Social History  . Marital status: Married    Spouse name: N/A  . Number of children: N/A  . Years of  education: N/A   Occupational History  . Not on file.   Social History Main Topics  . Smoking status: Never Smoker  . Smokeless tobacco: Never Used  . Alcohol use No  . Drug use: No  . Sexual activity: Yes    Birth control/ protection: IUD     Comment: Mirena IUD placed in 2016   Other Topics Concern  . Not on file   Social History Narrative  . No narrative on file   History reviewed. No pertinent family history.  Allergies  Zoloft *ANTIDEPRESSANTS*  Zithromax Z-Pak *MACROLIDES*  Allergies Reconciled   Medication History ZyrTEC Allergy (10MG  Capsule, Oral) Active. Probiotic (250MG  Capsule, Oral) Active. Multivitamin Adult (Oral) Active. Vitamin D (Cholecalciferol) (1000UNIT Tablet, Oral) Active. Flonase (50MCG/ACT Suspension, Nasal) Active.   BP 132/72   Pulse 67   Temp 98.3 F (36.8 C) (Oral)   Resp 16   Wt 84.8 kg (187 lb)   LMP 03/01/2017 (Approximate)   SpO2 99%   Breastfeeding? No   BMI 33.13 kg/m     Physical Exam  The physical exam findings are as follows: Note:General: WN WF alert and generally healthy appearing. Has long hair. HEENT: Normal. Pupils equal.  Neck: Supple. No mass. No thyroid mass.  Lymph Nodes: No supraclavicular or cervical nodes.  Lungs: Clear to auscultation and symmetric breath sounds. Heart: RRR. No murmur or rub.  Abdomen: Soft. No mass. No tenderness. No  hernia. Normal bowel sounds. No abdominal scars. Rectal: She has a punctum to the right of her anus about 4 cm away. Fistula probe easily inserts and traverses towards the anal canal  Extremities: Good strength and ROM in upper and lower extremities.  Neurologic: Grossly intact to motor and sensory function. Psychiatric: Has normal mood and affect. Behavior is normal.    Assessment & Plan  FISTULA-IN-ANO (K60.3) Impression: Patient has a right lateral anal fistula. We discussed her surgical options in detail. I think she most likely has a  fistula.  I have recommended an EUA with possible fistulotomy vs seton.  She understands that she will need a 2nd surgery if a seton is placed and she will have a small chance of incontinence if a fistulotomy is performed.

## 2017-03-25 NOTE — Op Note (Addendum)
03/25/2017  10:01 AM  PATIENT:  Renee Bryan  31 y.o. female  Patient Care Team: Aura Dials, MD as PCP - General (Family Medicine)  PRE-OPERATIVE DIAGNOSIS:  anal fistula  POST-OPERATIVE DIAGNOSIS:  anal fistula  PROCEDURE:  Procedure(s): EXAM UNDER ANESTHESIA PLACEMENT OF SETON   Surgeon(s): Leighton Ruff, MD  ASSISTANT: none   ANESTHESIA:   local and MAC  SPECIMEN:  No Specimen  DISPOSITION OF SPECIMEN:  N/A  COUNTS:  YES  PLAN OF CARE: Discharge to home after PACU  PATIENT DISPOSITION:  PACU - hemodynamically stable.  INDICATION: 31 year old female who presents to my office with a right lateral anal fistula   OR FINDINGS: right lateral transient sphincteric anal fistula  DESCRIPTION: the patient was identified in the preoperative holding area and taken to the OR where they were laid on the operating room table.  MAC anesthesia was induced without difficulty. The patient was then positioned in prone jackknife position with buttocks gently taped apart.  The patient was then prepped and draped in usual sterile fashion.  SCDs were noted to be in place prior to the initiation of anesthesia. A surgical timeout was performed indicating the correct patient, procedure, positioning and need for preoperative antibiotics.  A rectal block was performed using Marcaine with epinephrine.    I began with a digital rectal exam.  There were no masses palpated.  I then placed a Hill-Ferguson anoscope into the anal canal and evaluated this completely.  There was a slight dimpling of the right lateral dentate line suggesting an internal opening at the spot. I placed an S-shaped fistula probe through the external opening. I could not get it to exit at this spot and therefore I injected hydrogen peroxide into the cavity. This accumulated underneath the mucosa at the level I previously suspected. I was unable to place the fistula probe through the correct fistula tract. I then tied an  Ethibond suture to the fistula probe and brought this through the tract. The vessel loop was then pulled through the fistula track and secured into place with 3 Ethibond sutures. This completed placement of the seton. Patient was then awakened from anesthesia and sent to the postanesthesia carried in stable condition. All counts are correct per operating room staff.     I have reviewed the Sac and see no other narcotic prescriptions listed.

## 2017-03-26 ENCOUNTER — Encounter (HOSPITAL_BASED_OUTPATIENT_CLINIC_OR_DEPARTMENT_OTHER): Payer: Self-pay | Admitting: General Surgery

## 2017-05-30 DIAGNOSIS — J0140 Acute pansinusitis, unspecified: Secondary | ICD-10-CM | POA: Diagnosis not present

## 2017-05-30 DIAGNOSIS — R05 Cough: Secondary | ICD-10-CM | POA: Diagnosis not present

## 2017-06-14 ENCOUNTER — Ambulatory Visit: Payer: Self-pay | Admitting: General Surgery

## 2017-06-14 NOTE — H&P (Signed)
History of Present Illness Renee Bryan(Renee Christiana MD; 06/14/2017 12:18 PM) The patient is a 31 year old female who presents with a complaint of anal problems. [name] had a right perirectal abscess drained in our office around 2015. She represented in May 2017 with recurrent abscess. This time she was taken to the operating room by Dr. Donell BeersByerly and underwent incision and drainage of right perirectal abscess on 13 Nov 2015. She did well for about a year, when she noticed some drainage in her right buttocks just lateral to the anus. This would flare up every 3 or 4 weeks and drain and then get better.  She was told that she had a redundant colon when she had a colonoscopy in sixth-grade for chronic constipation. She says she has irritable bowel syndrome and has some GI issues particularly under stress. She's had no chronic diarrhea, nor any history of Crohn's disease. She underwent a seton placement in mid September 2018. Since that time she has been relatively asymptomatic.   Problem List/Past Medical Renee Bryan(Renee Dalton, MD; 06/14/2017 12:18 PM) FISTULA-IN-ANO (K60.3)  Past Surgical History Renee Bryan(Renee Lagunes, MD; 06/14/2017 12:18 PM) Oral Surgery  Diagnostic Studies History Renee Bryan(Renee Pannone, MD; 06/14/2017 12:18 PM) Colonoscopy >10 years ago Mammogram never Pap Smear 1-5 years ago  Allergies (Renee Bryan, RMA; 06/14/2017 12:01 PM) Zoloft *ANTIDEPRESSANTS* Zithromax Z-Pak *MACROLIDES* Allergies Reconciled  Medication History (Renee Bryan, RMA; 06/14/2017 12:01 PM) Flonase (50MCG/ACT Suspension, Nasal) Active. ZyrTEC Allergy (10MG  Capsule, Oral) Active. Probiotic (250MG  Capsule, Oral) Active. Cetirizine HCl (10MG  Tablet, Oral) Active. Vitamin D (Cholecalciferol) (1000UNIT Tablet, Oral) Active. Medications Reconciled  Social History Renee Bryan(Renee Bucklin, MD; 06/14/2017 12:18 PM) Alcohol use Occasional alcohol use. Caffeine use Coffee. No drug use Tobacco use  Never smoker.  Family History Renee Bryan(Renee Raffo, MD; 06/14/2017 12:18 PM) Heart disease in female family member before age 33 Hypertension Mother. Thyroid problems Mother.  Pregnancy / Birth History Renee Bryan(Renee Wadas, MD; 06/14/2017 12:18 PM) Age at menarche 9 years. Contraceptive History Intrauterine device, Oral contraceptives. Gravida 2 Irregular periods Maternal age 33-30 Para 1     Review of Systems Renee Bryan(Elinore Shults MD; 06/14/2017 12:18 PM) General Present- Night Sweats. Not Present- Appetite Loss, Chills, Fatigue, Fever, Weight Gain and Weight Loss. Skin Present- New Lesions. Not Present- Change in Wart/Mole, Dryness, Hives, Jaundice, Non-Healing Wounds, Rash and Ulcer. HEENT Present- Seasonal Allergies and Wears glasses/contact lenses. Not Present- Earache, Hearing Loss, Hoarseness, Nose Bleed, Oral Ulcers, Ringing in the Ears, Sinus Pain, Sore Throat, Visual Disturbances and Yellow Eyes. Respiratory Not Present- Bloody sputum, Chronic Cough, Difficulty Breathing, Snoring and Wheezing. Breast Not Present- Breast Mass, Breast Pain, Nipple Discharge and Skin Changes. Cardiovascular Not Present- Chest Pain, Difficulty Breathing Lying Down, Leg Cramps, Palpitations, Rapid Heart Rate, Shortness of Breath and Swelling of Extremities. Gastrointestinal Present- Rectal Pain. Not Present- Abdominal Pain, Bloating, Bloody Stool, Change in Bowel Habits, Chronic diarrhea, Constipation, Difficulty Swallowing, Excessive gas, Gets full quickly at meals, Hemorrhoids, Indigestion, Nausea and Vomiting. Female Genitourinary Not Present- Frequency, Nocturia, Painful Urination, Pelvic Pain and Urgency. Musculoskeletal Not Present- Back Pain, Joint Pain, Joint Stiffness, Muscle Pain, Muscle Weakness and Swelling of Extremities. Neurological Present- Headaches. Not Present- Decreased Memory, Fainting, Numbness, Seizures, Tingling, Tremor, Trouble walking and Weakness. Psychiatric Not Present- Anxiety,  Bipolar, Change in Sleep Pattern, Depression, Fearful and Frequent crying. Endocrine Not Present- Cold Intolerance, Excessive Hunger, Hair Changes, Heat Intolerance, Hot flashes and New Diabetes. Hematology Not Present- Easy Bruising, Excessive bleeding, Gland problems, HIV and Persistent Infections.  Vitals (Renee A. Brown RMA; 06/14/2017 12:01 PM) 06/14/2017 12:00 PM Weight: 185.2 lb Height: 63in Body Surface Area: 1.87 m Body Mass Index: 32.81 kg/m  Temp.: 98.2F  Pulse: 66 (Regular)  BP: 118/76 (Sitting, Left Arm, Standard)      Physical Exam (Jacorey Donaway MD; 06/14/2017 12:19 PM)  General Mental Status-Alert. General Appearance-Not in acute distress. Build & Nutrition-Well nourished. Posture-Normal posture. Gait-Normal.  Head and Neck Head-normocephalic, atraumatic with no lesions or palpable masses. Trachea-midline.  Chest and Lung Exam Chest and lung exam reveals -on auscultation, normal breath sounds, no adventitious sounds and normal vocal resonance.  Cardiovascular Cardiovascular examination reveals -normal heart sounds, regular rate and rhythm with no murmurs and no digital clubbing, cyanosis, edema, increased warmth or tenderness.  Abdomen Inspection Inspection of the abdomen reveals - No Hernias. Palpation/Percussion Palpation and Percussion of the abdomen reveal - Soft, Non Tender, No Rigidity (guarding), No hepatosplenomegaly and No Palpable abdominal masses.  Rectal Note: seton in place  Neurologic Neurologic evaluation reveals -alert and oriented x 3 with no impairment of recent or remote memory, normal attention span and ability to concentrate, normal sensation and normal coordination.  Musculoskeletal Normal Exam - Bilateral-Upper Extremity Strength Normal and Lower Extremity Strength Normal.    Assessment & Plan (Crystelle Ferrufino MD; 06/14/2017 12:17 PM)  FISTULA-IN-ANO (K60.3) Impression: 31-year-old female  who underwent seton placement in mid September 2018 for transsphincteric anal fistula. She is here today to discuss definitive repair. I recommended a lift procedure. We discussed the risk of recurrence and typical postoperative recovery time. We discussed other rare red which include bleeding and incontinence. I believe she understands this and has agreed to proceed with the surgery. 

## 2017-06-14 NOTE — H&P (View-Only) (Signed)
History of Present Illness Renee Bryan([name] MD; 06/14/2017 12:18 PM) The patient is a 31 year old female who presents with a complaint of anal problems. [name] had a right perirectal abscess drained in our office around 2015. She represented in May 2017 with recurrent abscess. This time she was taken to the operating room by Dr. Donell BeersByerly and underwent incision and drainage of right perirectal abscess on 13 Nov 2015. She did well for about a year, when she noticed some drainage in her right buttocks just lateral to the anus. This would flare up every 3 or 4 weeks and drain and then get better.  She was told that she had a redundant colon when she had a colonoscopy in sixth-grade for chronic constipation. She says she has irritable bowel syndrome and has some GI issues particularly under stress. She's had no chronic diarrhea, nor any history of Crohn's disease. She underwent a seton placement in mid September 2018. Since that time she has been relatively asymptomatic.   Problem List/Past Medical Renee Bryan(Ala Capri, MD; 06/14/2017 12:18 PM) FISTULA-IN-ANO (K60.3)  Past Surgical History Renee Bryan(Khiem Gargis, MD; 06/14/2017 12:18 PM) Oral Surgery  Diagnostic Studies History Renee Bryan(Rheta Hemmelgarn, MD; 06/14/2017 12:18 PM) Colonoscopy >10 years ago Mammogram never Pap Smear 1-5 years ago  Allergies ([name], RMA; 06/14/2017 12:01 PM) Zoloft *ANTIDEPRESSANTS* Zithromax Z-Pak *MACROLIDES* Allergies Reconciled  Medication History ([name], RMA; 06/14/2017 12:01 PM) Flonase (50MCG/ACT Suspension, Nasal) Active. ZyrTEC Allergy (10MG  Capsule, Oral) Active. Probiotic (250MG  Capsule, Oral) Active. Cetirizine HCl (10MG  Tablet, Oral) Active. Vitamin D (Cholecalciferol) (1000UNIT Tablet, Oral) Active. Medications Reconciled  Social History Renee Bryan(Shekela Goodridge, MD; 06/14/2017 12:18 PM) Alcohol use Occasional alcohol use. Caffeine use Coffee. No drug use Tobacco use  Never smoker.  Family History Renee Bryan(Haleema Vanderheyden, MD; 06/14/2017 12:18 PM) Heart disease in female family member before age 33 Hypertension Mother. Thyroid problems Mother.  Pregnancy / Birth History Renee Bryan(Isabelle Matt, MD; 06/14/2017 12:18 PM) Age at menarche 9 years. Contraceptive History Intrauterine device, Oral contraceptives. Gravida 2 Irregular periods Maternal age 33-30 Para 1     Review of Systems Renee Bryan(Evart Mcdonnell MD; 06/14/2017 12:18 PM) General Present- Night Sweats. Not Present- Appetite Loss, Chills, Fatigue, Fever, Weight Gain and Weight Loss. Skin Present- New Lesions. Not Present- Change in Wart/Mole, Dryness, Hives, Jaundice, Non-Healing Wounds, Rash and Ulcer. HEENT Present- Seasonal Allergies and Wears glasses/contact lenses. Not Present- Earache, Hearing Loss, Hoarseness, Nose Bleed, Oral Ulcers, Ringing in the Ears, Sinus Pain, Sore Throat, Visual Disturbances and Yellow Eyes. Respiratory Not Present- Bloody sputum, Chronic Cough, Difficulty Breathing, Snoring and Wheezing. Breast Not Present- Breast Mass, Breast Pain, Nipple Discharge and Skin Changes. Cardiovascular Not Present- Chest Pain, Difficulty Breathing Lying Down, Leg Cramps, Palpitations, Rapid Heart Rate, Shortness of Breath and Swelling of Extremities. Gastrointestinal Present- Rectal Pain. Not Present- Abdominal Pain, Bloating, Bloody Stool, Change in Bowel Habits, Chronic diarrhea, Constipation, Difficulty Swallowing, Excessive gas, Gets full quickly at meals, Hemorrhoids, Indigestion, Nausea and Vomiting. Female Genitourinary Not Present- Frequency, Nocturia, Painful Urination, Pelvic Pain and Urgency. Musculoskeletal Not Present- Back Pain, Joint Pain, Joint Stiffness, Muscle Pain, Muscle Weakness and Swelling of Extremities. Neurological Present- Headaches. Not Present- Decreased Memory, Fainting, Numbness, Seizures, Tingling, Tremor, Trouble walking and Weakness. Psychiatric Not Present- Anxiety,  Bipolar, Change in Sleep Pattern, Depression, Fearful and Frequent crying. Endocrine Not Present- Cold Intolerance, Excessive Hunger, Hair Changes, Heat Intolerance, Hot flashes and New Diabetes. Hematology Not Present- Easy Bruising, Excessive bleeding, Gland problems, HIV and Persistent Infections.  Vitals (Tanisha A. Brown RMA; 06/14/2017 12:01 PM) 06/14/2017 12:00 PM Weight: 185.2 lb Height: 63in Body Surface Area: 1.87 m Body Mass Index: 32.81 kg/m  Temp.: 98.18F  Pulse: 66 (Regular)  BP: 118/76 (Sitting, Left Arm, Standard)      Physical Exam Renee Bryan(Doshia Dalia MD; 06/14/2017 12:19 PM)  General Mental Status-Alert. General Appearance-Not in acute distress. Build & Nutrition-Well nourished. Posture-Normal posture. Gait-Normal.  Head and Neck Head-normocephalic, atraumatic with no lesions or palpable masses. Trachea-midline.  Chest and Lung Exam Chest and lung exam reveals -on auscultation, normal breath sounds, no adventitious sounds and normal vocal resonance.  Cardiovascular Cardiovascular examination reveals -normal heart sounds, regular rate and rhythm with no murmurs and no digital clubbing, cyanosis, edema, increased warmth or tenderness.  Abdomen Inspection Inspection of the abdomen reveals - No Hernias. Palpation/Percussion Palpation and Percussion of the abdomen reveal - Soft, Non Tender, No Rigidity (guarding), No hepatosplenomegaly and No Palpable abdominal masses.  Rectal Note: seton in place  Neurologic Neurologic evaluation reveals -alert and oriented x 3 with no impairment of recent or remote memory, normal attention span and ability to concentrate, normal sensation and normal coordination.  Musculoskeletal Normal Exam - Bilateral-Upper Extremity Strength Normal and Lower Extremity Strength Normal.    Assessment & Plan Renee Bryan(Canon Gola MD; 06/14/2017 12:17 PM)  FISTULA-IN-ANO (K60.3) Impression: 31 year old female  who underwent seton placement in mid September 2018 for transsphincteric anal fistula. She is here today to discuss definitive repair. I recommended a lift procedure. We discussed the risk of recurrence and typical postoperative recovery time. We discussed other rare red which include bleeding and incontinence. I believe she understands this and has agreed to proceed with the surgery.

## 2017-07-01 ENCOUNTER — Encounter (HOSPITAL_BASED_OUTPATIENT_CLINIC_OR_DEPARTMENT_OTHER): Payer: Self-pay | Admitting: *Deleted

## 2017-07-01 ENCOUNTER — Other Ambulatory Visit: Payer: Self-pay

## 2017-07-01 NOTE — Progress Notes (Signed)
SPOKE W/ PT VIA PHONE FOR PRE-OP INTERVIEW.  NPO AFTER MN W/ EXCEPTION CLEAR LIQUIDS UNTIL 0815 (NO CREAM/ MILK PRODUCTS).  ARRIVE AT 1215.  NEEDS HG AND URINE PREG.  WILL TAKE AM MEDS W/ SIPS OF WATER.

## 2017-07-08 ENCOUNTER — Encounter (HOSPITAL_BASED_OUTPATIENT_CLINIC_OR_DEPARTMENT_OTHER)
Admission: RE | Disposition: A | Discharge status: Home / Self Care | Payer: Self-pay | Source: Ambulatory Visit | Attending: General Surgery

## 2017-07-08 ENCOUNTER — Ambulatory Visit (HOSPITAL_BASED_OUTPATIENT_CLINIC_OR_DEPARTMENT_OTHER): Payer: BLUE CROSS/BLUE SHIELD | Admitting: Anesthesiology

## 2017-07-08 ENCOUNTER — Encounter (HOSPITAL_BASED_OUTPATIENT_CLINIC_OR_DEPARTMENT_OTHER): Payer: Self-pay | Admitting: *Deleted

## 2017-07-08 ENCOUNTER — Other Ambulatory Visit: Payer: Self-pay

## 2017-07-08 ENCOUNTER — Ambulatory Visit (HOSPITAL_BASED_OUTPATIENT_CLINIC_OR_DEPARTMENT_OTHER)
Admission: RE | Admit: 2017-07-08 | Discharge: 2017-07-08 | Disposition: A | Discharge status: Home / Self Care | Payer: BLUE CROSS/BLUE SHIELD | Source: Ambulatory Visit | Attending: General Surgery | Admitting: General Surgery

## 2017-07-08 DIAGNOSIS — F419 Anxiety disorder, unspecified: Secondary | ICD-10-CM | POA: Diagnosis not present

## 2017-07-08 DIAGNOSIS — K589 Irritable bowel syndrome without diarrhea: Secondary | ICD-10-CM | POA: Insufficient documentation

## 2017-07-08 DIAGNOSIS — J309 Allergic rhinitis, unspecified: Secondary | ICD-10-CM | POA: Diagnosis not present

## 2017-07-08 DIAGNOSIS — F329 Major depressive disorder, single episode, unspecified: Secondary | ICD-10-CM | POA: Diagnosis not present

## 2017-07-08 DIAGNOSIS — Z881 Allergy status to other antibiotic agents status: Secondary | ICD-10-CM | POA: Insufficient documentation

## 2017-07-08 DIAGNOSIS — K603 Anal fistula: Secondary | ICD-10-CM | POA: Diagnosis not present

## 2017-07-08 DIAGNOSIS — J45909 Unspecified asthma, uncomplicated: Secondary | ICD-10-CM | POA: Insufficient documentation

## 2017-07-08 DIAGNOSIS — Z888 Allergy status to other drugs, medicaments and biological substances status: Secondary | ICD-10-CM | POA: Diagnosis not present

## 2017-07-08 DIAGNOSIS — Z79899 Other long term (current) drug therapy: Secondary | ICD-10-CM | POA: Diagnosis not present

## 2017-07-08 DIAGNOSIS — K611 Rectal abscess: Secondary | ICD-10-CM | POA: Diagnosis not present

## 2017-07-08 HISTORY — PX: LIGATION OF INTERNAL FISTULA TRACT: SHX6551

## 2017-07-08 LAB — POCT PREGNANCY, URINE: PREG TEST UR: NEGATIVE

## 2017-07-08 SURGERY — LIGATION, INTERNAL FISTULA TRACT
Anesthesia: General

## 2017-07-08 MED ORDER — GABAPENTIN 300 MG PO CAPS
ORAL_CAPSULE | ORAL | Status: AC
  Filled 2017-07-08: qty 1

## 2017-07-08 MED ORDER — ONDANSETRON HCL 4 MG/2ML IJ SOLN
INTRAMUSCULAR | Status: AC
  Filled 2017-07-08: qty 2

## 2017-07-08 MED ORDER — CELECOXIB 200 MG PO CAPS
ORAL_CAPSULE | ORAL | Status: AC
  Filled 2017-07-08: qty 1

## 2017-07-08 MED ORDER — DEXAMETHASONE SODIUM PHOSPHATE 10 MG/ML IJ SOLN
INTRAMUSCULAR | Status: AC
  Filled 2017-07-08: qty 1

## 2017-07-08 MED ORDER — ACETAMINOPHEN 325 MG PO TABS
650.0000 mg | ORAL_TABLET | ORAL | Status: DC | PRN
  Filled 2017-07-08: qty 2

## 2017-07-08 MED ORDER — ACETAMINOPHEN 500 MG PO TABS
ORAL_TABLET | ORAL | Status: AC
  Filled 2017-07-08: qty 2

## 2017-07-08 MED ORDER — PROPOFOL 10 MG/ML IV BOLUS
INTRAVENOUS | Status: DC | PRN
  Administered 2017-07-08: 200 mg via INTRAVENOUS

## 2017-07-08 MED ORDER — ACETAMINOPHEN 650 MG RE SUPP
650.0000 mg | RECTAL | Status: DC | PRN
  Filled 2017-07-08: qty 1

## 2017-07-08 MED ORDER — ROCURONIUM BROMIDE 50 MG/5ML IV SOSY
PREFILLED_SYRINGE | INTRAVENOUS | Status: AC
  Filled 2017-07-08: qty 5

## 2017-07-08 MED ORDER — CELECOXIB 200 MG PO CAPS
200.0000 mg | ORAL_CAPSULE | ORAL | Status: AC
  Administered 2017-07-08: 200 mg via ORAL
  Filled 2017-07-08: qty 1

## 2017-07-08 MED ORDER — DEXAMETHASONE SODIUM PHOSPHATE 10 MG/ML IJ SOLN
INTRAMUSCULAR | Status: AC
Start: 2017-07-08 — End: 2017-07-08
  Filled 2017-07-08: qty 1

## 2017-07-08 MED ORDER — SUGAMMADEX SODIUM 200 MG/2ML IV SOLN
INTRAVENOUS | Status: AC
  Filled 2017-07-08: qty 2

## 2017-07-08 MED ORDER — MEPERIDINE HCL 25 MG/ML IJ SOLN
6.2500 mg | INTRAMUSCULAR | Status: DC | PRN
  Filled 2017-07-08: qty 1

## 2017-07-08 MED ORDER — ACETAMINOPHEN 500 MG PO TABS
1000.0000 mg | ORAL_TABLET | ORAL | Status: AC
  Administered 2017-07-08: 500 mg via ORAL
  Filled 2017-07-08: qty 2

## 2017-07-08 MED ORDER — LIDOCAINE 2% (20 MG/ML) 5 ML SYRINGE
INTRAMUSCULAR | Status: AC
  Filled 2017-07-08: qty 5

## 2017-07-08 MED ORDER — DEXAMETHASONE SODIUM PHOSPHATE 10 MG/ML IJ SOLN
INTRAMUSCULAR | Status: DC | PRN
  Administered 2017-07-08: 10 mg via INTRAVENOUS

## 2017-07-08 MED ORDER — BUPIVACAINE-EPINEPHRINE 0.5% -1:200000 IJ SOLN
INTRAMUSCULAR | Status: DC | PRN
  Administered 2017-07-08: 30 mL

## 2017-07-08 MED ORDER — SODIUM CHLORIDE 0.9 % IV SOLN
250.0000 mL | INTRAVENOUS | Status: DC | PRN
  Filled 2017-07-08: qty 250

## 2017-07-08 MED ORDER — ONDANSETRON HCL 4 MG/2ML IJ SOLN
INTRAMUSCULAR | Status: DC | PRN
  Administered 2017-07-08: 4 mg via INTRAVENOUS

## 2017-07-08 MED ORDER — MIDAZOLAM HCL 2 MG/2ML IJ SOLN
INTRAMUSCULAR | Status: DC | PRN
  Administered 2017-07-08: 2 mg via INTRAVENOUS

## 2017-07-08 MED ORDER — SUGAMMADEX SODIUM 200 MG/2ML IV SOLN
INTRAVENOUS | Status: DC | PRN
  Administered 2017-07-08: 200 mg via INTRAVENOUS

## 2017-07-08 MED ORDER — LACTATED RINGERS IV SOLN
INTRAVENOUS | Status: DC
  Administered 2017-07-08 (×2): via INTRAVENOUS
  Filled 2017-07-08: qty 1000

## 2017-07-08 MED ORDER — BUPIVACAINE LIPOSOME 1.3 % IJ SUSP
INTRAMUSCULAR | Status: DC | PRN
  Administered 2017-07-08: 20 mL

## 2017-07-08 MED ORDER — SODIUM CHLORIDE 0.9% FLUSH
3.0000 mL | Freq: Two times a day (BID) | INTRAVENOUS | Status: DC
  Filled 2017-07-08: qty 3

## 2017-07-08 MED ORDER — PROMETHAZINE HCL 25 MG/ML IJ SOLN
6.2500 mg | INTRAMUSCULAR | Status: DC | PRN
  Filled 2017-07-08: qty 1

## 2017-07-08 MED ORDER — ROCURONIUM BROMIDE 10 MG/ML (PF) SYRINGE
PREFILLED_SYRINGE | INTRAVENOUS | Status: DC | PRN
  Administered 2017-07-08: 40 mg via INTRAVENOUS

## 2017-07-08 MED ORDER — LIDOCAINE 2% (20 MG/ML) 5 ML SYRINGE
INTRAMUSCULAR | Status: DC | PRN
  Administered 2017-07-08: 30 mg via INTRAVENOUS

## 2017-07-08 MED ORDER — SODIUM CHLORIDE 0.9% FLUSH
3.0000 mL | INTRAVENOUS | Status: DC | PRN
  Filled 2017-07-08: qty 3

## 2017-07-08 MED ORDER — OXYCODONE HCL 5 MG PO TABS
5.0000 mg | ORAL_TABLET | ORAL | Status: DC | PRN
  Filled 2017-07-08: qty 2

## 2017-07-08 MED ORDER — OXYCODONE HCL 5 MG PO TABS: 5.0000 mg | ORAL_TABLET | Freq: Four times a day (QID) | ORAL | 0 refills | Status: DC | PRN

## 2017-07-08 MED ORDER — FENTANYL CITRATE (PF) 100 MCG/2ML IJ SOLN
INTRAMUSCULAR | Status: AC
  Filled 2017-07-08: qty 2

## 2017-07-08 MED ORDER — PROPOFOL 10 MG/ML IV BOLUS
INTRAVENOUS | Status: AC
  Filled 2017-07-08: qty 20

## 2017-07-08 MED ORDER — MIDAZOLAM HCL 2 MG/2ML IJ SOLN
0.5000 mg | Freq: Once | INTRAMUSCULAR | Status: DC | PRN
  Filled 2017-07-08: qty 2

## 2017-07-08 MED ORDER — SCOPOLAMINE 1 MG/3DAYS TD PT72
MEDICATED_PATCH | TRANSDERMAL | Status: AC
  Filled 2017-07-08: qty 1

## 2017-07-08 MED ORDER — FENTANYL CITRATE (PF) 100 MCG/2ML IJ SOLN
25.0000 ug | INTRAMUSCULAR | Status: DC | PRN
  Filled 2017-07-08: qty 1

## 2017-07-08 MED ORDER — MIDAZOLAM HCL 2 MG/2ML IJ SOLN
INTRAMUSCULAR | Status: AC
  Filled 2017-07-08: qty 2

## 2017-07-08 MED ORDER — SCOPOLAMINE 1 MG/3DAYS TD PT72
1.0000 | MEDICATED_PATCH | Freq: Once | TRANSDERMAL | Status: AC
  Administered 2017-07-08: 1 via TRANSDERMAL
  Filled 2017-07-08: qty 1

## 2017-07-08 MED ORDER — GABAPENTIN 300 MG PO CAPS
300.0000 mg | ORAL_CAPSULE | ORAL | Status: AC
  Administered 2017-07-08: 300 mg via ORAL
  Filled 2017-07-08: qty 1

## 2017-07-08 MED ORDER — 0.9 % SODIUM CHLORIDE (POUR BTL) OPTIME
TOPICAL | Status: DC | PRN
  Administered 2017-07-08: 500 mL

## 2017-07-08 MED ORDER — FENTANYL CITRATE (PF) 100 MCG/2ML IJ SOLN
INTRAMUSCULAR | Status: DC | PRN
  Administered 2017-07-08: 100 ug via INTRAVENOUS

## 2017-07-08 SURGICAL SUPPLY — 52 items
BENZOIN TINCTURE PRP APPL 2/3 (GAUZE/BANDAGES/DRESSINGS) ×2 IMPLANT
BLADE HEX COATED 2.75 (ELECTRODE) ×2 IMPLANT
BLADE SURG 10 STRL SS (BLADE) IMPLANT
BLADE SURG 15 STRL LF DISP TIS (BLADE) ×1 IMPLANT
BLADE SURG 15 STRL SS (BLADE) ×1
BRIEF STRETCH FOR OB PAD LRG (UNDERPADS AND DIAPERS) ×4 IMPLANT
CANISTER SUCT 3000ML PPV (MISCELLANEOUS) ×2 IMPLANT
COVER BACK TABLE 60X90IN (DRAPES) ×2 IMPLANT
COVER MAYO STAND STRL (DRAPES) ×2 IMPLANT
DECANTER SPIKE VIAL GLASS SM (MISCELLANEOUS) ×2 IMPLANT
DRAPE LAPAROTOMY 100X72 PEDS (DRAPES) ×2 IMPLANT
DRAPE UTILITY XL STRL (DRAPES) ×2 IMPLANT
ELECT BLADE 6.5 .24CM SHAFT (ELECTRODE) ×2 IMPLANT
ELECT REM PT RETURN 9FT ADLT (ELECTROSURGICAL) ×2
ELECTRODE REM PT RTRN 9FT ADLT (ELECTROSURGICAL) ×1 IMPLANT
GAUZE SPONGE 4X4 16PLY XRAY LF (GAUZE/BANDAGES/DRESSINGS) IMPLANT
GAUZE VASELINE 3X9 (GAUZE/BANDAGES/DRESSINGS) IMPLANT
GLOVE BIO SURGEON STRL SZ 6.5 (GLOVE) ×2 IMPLANT
GLOVE INDICATOR 7.0 STRL GRN (GLOVE) ×2 IMPLANT
GOWN SPEC L3 XXLG W/TWL (GOWN DISPOSABLE) ×2 IMPLANT
GOWN STRL REUS W/ TWL XL LVL3 (GOWN DISPOSABLE) ×1 IMPLANT
GOWN STRL REUS W/TWL XL LVL3 (GOWN DISPOSABLE) ×1
HYDROGEN PEROXIDE 16OZ (MISCELLANEOUS) ×2 IMPLANT
IV CATH 18G SAFETY (IV SOLUTION) ×2 IMPLANT
KIT RM TURNOVER CYSTO AR (KITS) ×2 IMPLANT
LOOP VESSEL MAXI BLUE (MISCELLANEOUS) IMPLANT
NEEDLE HYPO 25X1 1.5 SAFETY (NEEDLE) ×2 IMPLANT
NS IRRIG 500ML POUR BTL (IV SOLUTION) ×2 IMPLANT
PACK BASIN DAY SURGERY FS (CUSTOM PROCEDURE TRAY) ×2 IMPLANT
PAD ABD 8X10 STRL (GAUZE/BANDAGES/DRESSINGS) ×2 IMPLANT
PAD ARMBOARD 7.5X6 YLW CONV (MISCELLANEOUS) ×2 IMPLANT
PENCIL BUTTON HOLSTER BLD 10FT (ELECTRODE) ×2 IMPLANT
RETRACTOR STAY HOOK 5MM (MISCELLANEOUS) IMPLANT
RETRACTOR STERILE 25.8CMX11.3 (INSTRUMENTS) ×2 IMPLANT
SPONGE GAUZE 4X4 12PLY STER LF (GAUZE/BANDAGES/DRESSINGS) ×2 IMPLANT
SPONGE SURGIFOAM ABS GEL 12-7 (HEMOSTASIS) IMPLANT
SUCTION FRAZIER HANDLE 10FR (MISCELLANEOUS) ×1
SUCTION TUBE FRAZIER 10FR DISP (MISCELLANEOUS) ×1 IMPLANT
SUT CHROMIC 2 0 SH (SUTURE) IMPLANT
SUT CHROMIC 3 0 SH 27 (SUTURE) IMPLANT
SUT ETHIBOND 0 (SUTURE) IMPLANT
SUT SILK 2 0 (SUTURE)
SUT SILK 2-0 18XBRD TIE 12 (SUTURE) IMPLANT
SUT SILK 3 0 SH 30 (SUTURE) IMPLANT
SUT VIC AB 3-0 SH 8-18 (SUTURE) ×2 IMPLANT
SUT VICRYL 3 0 UR 6 27 (SUTURE) IMPLANT
SYR BULB IRRIGATION 50ML (SYRINGE) ×2 IMPLANT
SYR CONTROL 10ML LL (SYRINGE) ×2 IMPLANT
TOWEL OR 17X24 6PK STRL BLUE (TOWEL DISPOSABLE) ×2 IMPLANT
TRAY DSU PREP LF (CUSTOM PROCEDURE TRAY) ×2 IMPLANT
TUBE CONNECTING 12X1/4 (SUCTIONS) ×2 IMPLANT
YANKAUER SUCT BULB TIP NO VENT (SUCTIONS) ×2 IMPLANT

## 2017-07-08 NOTE — Anesthesia Postprocedure Evaluation (Signed)
Anesthesia Post Note  Patient: Renee Bryan  Procedure(s) Performed: LIGATION OF INTERNAL FISTULA TRACT (N/A )     Patient location during evaluation: PACU Anesthesia Type: General Level of consciousness: awake and alert, oriented and patient cooperative Pain management: pain level controlled Vital Signs Assessment: post-procedure vital signs reviewed and stable Respiratory status: spontaneous breathing, nonlabored ventilation and respiratory function stable Cardiovascular status: blood pressure returned to baseline and stable Postop Assessment: no apparent nausea or vomiting Anesthetic complications: no    Last Vitals:  Vitals:   07/08/17 1515 07/08/17 1530  BP: 132/76 128/78  Pulse: (!) 53 (!) 54  Resp: 11 11  Temp:    SpO2: 97% 97%    Last Pain:  Vitals:   07/08/17 1126  TempSrc: Oral                 Malyn Aytes,E. Bartolo Montanye

## 2017-07-08 NOTE — Interval H&P Note (Signed)
History and Physical Interval Note:  07/08/2017 11:41 AM  Renee Bryan  has presented today for surgery, with the diagnosis of anal fistula  The various methods of treatment have been discussed with the patient and family. After consideration of risks, benefits and other options for treatment, the patient has consented to  Procedure(s): LIGATION OF INTERNAL FISTULA TRACT (N/A) as a surgical intervention .  The patient's history has been reviewed, patient examined, no change in status, stable for surgery.  I have reviewed the patient's chart and labs.  Questions were answered to the patient's satisfaction.     Vanita PandaAlicia C Amahd Morino, MD  Colorectal and General Surgery [facility]

## 2017-07-08 NOTE — Anesthesia Procedure Notes (Signed)
Procedure Name: Intubation Date/Time: 07/08/2017 1:23 PM Performed by: Wanita Chamberlain, CRNA Pre-anesthesia Checklist: Patient identified, Timeout performed, Emergency Drugs available, Suction available and Patient being monitored Patient Re-evaluated:Patient Re-evaluated prior to induction Oxygen Delivery Method: Circle system utilized Preoxygenation: Pre-oxygenation with 100% oxygen Induction Type: IV induction Ventilation: Mask ventilation without difficulty Laryngoscope Size: Mac and 3 Grade View: Grade I Tube type: Oral Tube size: 7.0 mm Number of attempts: 1 Airway Equipment and Method: Stylet Placement Confirmation: breath sounds checked- equal and bilateral,  CO2 detector,  positive ETCO2 and ETT inserted through vocal cords under direct vision Secured at: 21 cm Tube secured with: Tape Dental Injury: Teeth and Oropharynx as per pre-operative assessment

## 2017-07-08 NOTE — Transfer of Care (Signed)
Immediate Anesthesia Transfer of Care Note  Patient: Renee Bryan  Procedure(s) Performed: LIGATION OF INTERNAL FISTULA TRACT (N/A )  Patient Location: PACU  Anesthesia Type:General  Level of Consciousness: awake, alert , oriented and patient cooperative  Airway & Oxygen Therapy: Patient Spontanous Breathing and Patient connected to nasal cannula oxygen  Post-op Assessment: Report given to RN and Post -op Vital signs reviewed and stable  Post vital signs: Reviewed and stable  Last Vitals:  Vitals:   07/08/17 1126  BP: 126/82  Pulse: 60  Resp: 18  Temp: 36.7 C  SpO2: 99%    Last Pain:  Vitals:   07/08/17 1126  TempSrc: Oral      Patients Stated Pain Goal: 5 (07/08/17 1151)  Complications: No apparent anesthesia complications

## 2017-07-08 NOTE — Discharge Instructions (Addendum)
°Post Anesthesia Home Care Instructions ° °Activity: °Get plenty of rest for the remainder of the day. A responsible individual must stay with you for 24 hours following the procedure.  °For the next 24 hours, DO NOT: °-Drive a car °-Operate machinery °-Drink alcoholic beverages °-Take any medication unless instructed by your physician °-Make any legal decisions or sign important papers. ° °Meals: °Start with liquid foods such as gelatin or soup. Progress to regular foods as tolerated. Avoid greasy, spicy, heavy foods. If nausea and/or vomiting occur, drink only clear liquids until the nausea and/or vomiting subsides. Call your physician if vomiting continues. ° °Special Instructions/Symptoms: °Your throat may feel dry or sore from the anesthesia or the breathing tube placed in your throat during surgery. If this causes discomfort, gargle with warm salt water. The discomfort should disappear within 24 hours. ° °If you had a scopolamine patch placed behind your ear for the management of post- operative nausea and/or vomiting: ° °1. The medication in the patch is effective for 72 hours, after which it should be removed.  Wrap patch in a tissue and discard in the trash. Wash hands thoroughly with soap and water. °2. You may remove the patch earlier than 72 hours if you experience unpleasant side effects which may include dry mouth, dizziness or visual disturbances. °3. Avoid touching the patch. Wash your hands with soap and water after contact with the patch. °  °Information for Discharge Teaching: °EXPAREL (bupivacaine liposome injectable suspension)  ° °Your surgeon gave you EXPAREL(bupivacaine) in your surgical incision to help control your pain after surgery.  °· EXPAREL is a local anesthetic that provides pain relief by numbing the tissue around the surgical site. °· EXPAREL is designed to release pain medication over time and can control pain for up to 72 hours. °· Depending on how you respond to EXPAREL, you  may require less pain medication during your recovery. ° °Possible side effects: °· Temporary loss of sensation or ability to move in the area where bupivacaine was injected. °· Nausea, vomiting, constipation °· Rarely, numbness and tingling in your mouth or lips, lightheadedness, or anxiety may occur. °· Call your doctor right away if you think you may be experiencing any of these sensations, or if you have other questions regarding possible side effects. ° °Follow all other discharge instructions given to you by your surgeon or nurse. Eat a healthy diet and drink plenty of water or other fluids. ° °If you return to the hospital for any reason within 96 hours following the administration of EXPAREL, please inform your health care providers.ANORECTAL SURGERY: POST OP INSTRUCTIONS °1. Take your usually prescribed home medications unless otherwise directed. °2. DIET: During the first few hours after surgery sip on some liquids until you are able to urinate.  It is normal to not urinate for several hours after this surgery.  If you feel uncomfortable, please contact the office for instructions.  After you are able to urinate,you may eat, if you feel like it.  Follow a light bland diet the first 24 hours after arrival home, such as soup, liquids, crackers, etc.  Be sure to include lots of fluids daily (6-8 glasses).  Avoid fast food or heavy meals, as your are more likely to get nauseated.  Eat a low fat diet the next few days after surgery.  Limit caffeine intake to 1-2 servings a day. °3. PAIN CONTROL: °a. Pain is best controlled by a usual combination of several different methods TOGETHER: °i. Muscle relaxation:   Soak in a warm bath (or Sitz bath) three times a day and after bowel movements.  Continue to do this until all pain is resolved. °ii. Over the counter pain medication °iii. Prescription pain medication °b. Most patients will experience some swelling and discomfort in the anus/rectal area and incisions.  Heat  such as warm towels, sitz baths, warm baths, etc to help relax tight/sore spots and speed recovery.  Some people prefer to use ice, especially in the first couple days after surgery, as it may decrease the pain and swelling, or alternate between ice & heat.  Experiment to what works for you.  Swelling and bruising can take several weeks to resolve.  Pain can take even longer to completely resolve. °c. It is helpful to take an over-the-counter pain medication regularly for the first few weeks.  Choose one of the following that works best for you: °i. Naproxen (Aleve, etc)  Two 220mg tabs twice a day °ii. Ibuprofen (Advil, etc) Three 200mg tabs four times a day (every meal & bedtime) °d. A  prescription for pain medication (such as percocet, oxycodone, hydrocodone, etc) should be given to you upon discharge.  Take your pain medication as prescribed.  °i. If you are having problems/concerns with the prescription medicine (does not control pain, nausea, vomiting, rash, itching, etc), please call us [phone] to see if we need to switch you to a different pain medicine that will work better for you and/or control your side effect better. °ii. If you need a refill on your pain medication, please contact your pharmacy.  They will contact our office to request authorization. Prescriptions will not be filled after 5 pm or on week-ends. °4. KEEP YOUR BOWELS REGULAR and AVOID CONSTIPATION °a. The goal is one to two soft bowel movements a day.  You should at least have a bowel movement every other day. °b. Avoid getting constipated.  Between the surgery and the pain medications, it is common to experience some constipation. This can be very painful after rectal surgery.  Increasing fluid intake and taking a fiber supplement (such as Metamucil, Citrucel, FiberCon, etc) 1-2 times a day regularly will usually help prevent this problem from occurring.  A stool softener like colace is also recommended.  This can be purchased  over the counter at your pharmacy.  You can take it up to 3 times a day.  If you do not have a bowel movement after 24 hrs since your surgery, take one does of milk of magnesia.  If you still haven't had a bowel movement 8-12 hours after that dose, take another dose.  If you don't have a bowel movement 48 hrs after surgery, purchase a Fleets enema from the drug store and administer gently per package instructions.  If you still are having trouble with your bowel movements after that, please call the office for further instructions. °c. If you develop diarrhea or have many loose bowel movements, simplify your diet to bland foods & liquids for a few days.  Stop any stool softeners and decrease your fiber supplement.  Switching to mild anti-diarrheal medications (Kayopectate, Pepto Bismol) can help.  If this worsens or does not improve, please call us. ° °5. Wound Care °a. Remove your bandages before your first bowel movement or 8 hours after surgery.     °b. Remove any wound packing material at this tim,e as well.  You do not need to repack the wound unless instructed otherwise.  Wear an absorbent pad or   soft cotton gauze in your underwear to catch any drainage and help keep the area clean. You should change this every 2-3 hours while awake. °c. Keep the area clean and dry.  Bathe / shower every day, especially after bowel movements.  Keep the area clean by showering / bathing over the incision / wound.   It is okay to soak an open wound to help wash it.  Wet wipes or showers / gentle washing after bowel movements is often less traumatic than regular toilet paper. °d. You may have some styrofoam-like soft packing in the rectum which will come out with the first bowel movement.  °e. You will often notice bleeding with bowel movements.  This should slow down by the end of the first week of surgery °f. Expect some drainage.  This should slow down, too, by the end of the first week of surgery.  Wear an absorbent pad or  soft cotton gauze in your underwear until the drainage stops. °g. Do Not sit on a rubber or pillow ring.  This can make you symptoms worse.  You may sit on a soft pillow if needed.  °6. ACTIVITIES as tolerated:   °a. You may resume regular (light) daily activities beginning the next day--such as daily self-care, walking, climbing stairs--gradually increasing activities as tolerated.  If you can walk 30 minutes without difficulty, it is safe to try more intense activity such as jogging, treadmill, bicycling, low-impact aerobics, swimming, etc. °b. Save the most intensive and strenuous activity for last such as sit-ups, heavy lifting, contact sports, etc  Refrain from any heavy lifting or straining until you are off narcotics for pain control.   °c. You may drive when you are no longer taking prescription pain medication, you can comfortably sit for long periods of time, and you can safely maneuver your car and apply brakes. °d. You may have sexual intercourse when it is comfortable.  °7. FOLLOW UP in our office °a. Please call [facility] at [phone] to set up an appointment to see your surgeon in the office for a follow-up appointment approximately 3-4 weeks after your surgery. °b. Make sure that you call for this appointment the day you arrive home to insure a convenient appointment time. °10. IF YOU HAVE DISABILITY OR FAMILY LEAVE FORMS, BRING THEM TO THE OFFICE FOR PROCESSING.  DO NOT GIVE THEM TO YOUR DOCTOR. ° ° ° ° °WHEN TO CALL US [phone]: °1. Poor pain control °2. Reactions / problems with new medications (rash/itching, nausea, etc)  °3. Fever over 101.5 F (38.5 C) °4. Inability to urinate °5. Nausea and/or vomiting °6. Worsening swelling or bruising °7. Continued bleeding from incision. °8. Increased pain, redness, or drainage from the incision ° °The clinic staff is available to answer your questions during regular business hours (8:30am-5pm).  Please don’t hesitate to call and ask to speak to one of  our nurses for clinical concerns.   A surgeon from [facility] is always on call at the hospitals °  °If you have a medical emergency, go to the nearest emergency room or call 911. °  ° °[facility] °[address], [address], [place], [place]  [address] ? °MAIN: [phone] ? TOLL FREE: [phone] ? °FAX [phone] °[email or web address] ° ° ° °

## 2017-07-08 NOTE — Anesthesia Preprocedure Evaluation (Addendum)
Anesthesia Evaluation  Patient identified by MRN, date of birth, ID band Patient awake    Reviewed: Allergy & Precautions, NPO status , Patient's Chart, lab work & pertinent test results  History of Anesthesia Complications Negative for: history of anesthetic complications  Airway Mallampati: II  TM Distance: >3 FB Neck ROM: Full    Dental  (+) Dental Advisory Given, Caps,    Pulmonary asthma (hasn't needed inhaler in 6 months) ,    breath sounds clear to auscultation       Cardiovascular Exercise Tolerance: Good negative cardio ROS   Rhythm:Regular Rate:Normal     Neuro/Psych Anxiety Depression negative neurological ROS     GI/Hepatic negative GI ROS, Neg liver ROS,   Endo/Other  negative endocrine ROS  Renal/GU negative Renal ROS     Musculoskeletal   Abdominal   Peds  Hematology negative hematology ROS (+) anemia ,   Anesthesia Other Findings   Reproductive/Obstetrics                          Anesthesia Physical Anesthesia Plan  ASA: II  Anesthesia Plan: General   Post-op Pain Management:    Induction: Intravenous  PONV Risk Score and Plan: 3 and Ondansetron, Dexamethasone and Scopolamine patch - Pre-op  Airway Management Planned: Oral ETT  Additional Equipment:   Intra-op Plan:   Post-operative Plan: Extubation in OR  Informed Consent: I have reviewed the patients History and Physical, chart, labs and discussed the procedure including the risks, benefits and alternatives for the proposed anesthesia with the patient or authorized representative who has indicated his/her understanding and acceptance.   Dental advisory given  Plan Discussed with: CRNA and Surgeon  Anesthesia Plan Comments: (Plan routine monitors, GETA)        Anesthesia Quick Evaluation

## 2017-07-08 NOTE — Op Note (Addendum)
07/08/2017  2:02 PM  PATIENT:  Renee Bryan  31 y.o. female  Patient Care Team: Henrine Screwshacker, Robert, MD as PCP - General (Family Medicine)  PRE-OPERATIVE DIAGNOSIS:  anal fistula  POST-OPERATIVE DIAGNOSIS:  anal fistula  PROCEDURE:  LIGATION OF INTERNAL FISTULA TRACT   Surgeon(s): Romie Leveehomas, Delisia Mcquiston, MD  ASSISTANT: none   ANESTHESIA:   local, general and topical  SPECIMEN:  No Specimen  DISPOSITION OF SPECIMEN:  N/A  COUNTS:  YES  PLAN OF CARE: Discharge to home after PACU  PATIENT DISPOSITION:  PACU - hemodynamically stable.  INDICATION: 31 y.o. F with trans-sphincteric anal fistula   OR FINDINGS: seton in place  DESCRIPTION: the patient was identified in the preoperative holding area and taken to the OR where they were laid on the operating room table.  General anesthesia was induced without difficulty. The patient was then positioned in prone jackknife position with buttocks gently taped apart.  The patient was then prepped and draped in usual sterile fashion.  SCDs were noted to be in place prior to the initiation of anesthesia. A surgical timeout was performed indicating the correct patient, procedure, positioning and need for preoperative antibiotics.  A rectal block was performed using Marcaine with epinephrine mixed with Exparel.     I began with a digital rectal exam.  There were no masses and there was good tone.  I then placed a Hill-Ferguson anoscope into the anal canal and evaluated this completely.  The internal opening had enlarged.  I dissected into this intersphincteric groove using a blunt right angle.  I was able to dissect underneath of the fistula tract and connect either side.  I then passed a 2-0 silk suture underneath the fistula tract and remove the seton.  The fistula tract was then divided using a 15 blade scalpel.  The mucosal defect was distal to the internal sphincter and therefore it was closed by reapproximating the internal sphincter to the remaining  anal mucosa using interrupted 3-0 Vicryl sutures.  I reinforced the 2-0 silk suture and the distal portion of the fistula tract with 3-0 Vicryl sutures.  I also reinforced the mucosal within the anal canal using a 3-0 chromic suture.  Once this was complete I injected hydrogen peroxide into the external fistula tract and there was no leak noted.  I then reapproximated the defect using 3-0 Vicryl sutures.  The skin was closed using 2-0 chromic suture.  The external opening was enlarged using electrocautery.  The remaining Exparel with Marcaine was injected around the surgical site for additional postoperative pain control.  Lidocaine ointment and sterile dressing were applied.  The patient was then awakened from anesthesia and sent to the postanesthesia care unit stable condition.  All counts were correct per operating room staff.  [facility] and see no other active narcotic prescriptions on file for this patient.

## 2017-07-09 ENCOUNTER — Encounter (HOSPITAL_BASED_OUTPATIENT_CLINIC_OR_DEPARTMENT_OTHER): Payer: Self-pay | Admitting: General Surgery

## 2017-07-14 LAB — POCT HEMOGLOBIN-HEMACUE: HEMOGLOBIN: 12.3 g/dL (ref 12.0–15.0)

## 2017-08-15 DIAGNOSIS — J069 Acute upper respiratory infection, unspecified: Secondary | ICD-10-CM | POA: Diagnosis not present

## 2017-09-30 DIAGNOSIS — J019 Acute sinusitis, unspecified: Secondary | ICD-10-CM | POA: Diagnosis not present

## 2018-01-07 DIAGNOSIS — L259 Unspecified contact dermatitis, unspecified cause: Secondary | ICD-10-CM | POA: Diagnosis not present

## 2018-01-11 DIAGNOSIS — Z1151 Encounter for screening for human papillomavirus (HPV): Secondary | ICD-10-CM | POA: Diagnosis not present

## 2018-01-11 DIAGNOSIS — Z6835 Body mass index (BMI) 35.0-35.9, adult: Secondary | ICD-10-CM | POA: Diagnosis not present

## 2018-01-11 DIAGNOSIS — R635 Abnormal weight gain: Secondary | ICD-10-CM | POA: Diagnosis not present

## 2018-01-11 DIAGNOSIS — Z01419 Encounter for gynecological examination (general) (routine) without abnormal findings: Secondary | ICD-10-CM | POA: Diagnosis not present

## 2018-01-12 LAB — HM PAP SMEAR: HM Pap smear: NEGATIVE

## 2018-01-20 DIAGNOSIS — R899 Unspecified abnormal finding in specimens from other organs, systems and tissues: Secondary | ICD-10-CM | POA: Diagnosis not present

## 2018-01-20 DIAGNOSIS — E663 Overweight: Secondary | ICD-10-CM | POA: Diagnosis not present

## 2018-01-20 DIAGNOSIS — Z6835 Body mass index (BMI) 35.0-35.9, adult: Secondary | ICD-10-CM | POA: Diagnosis not present

## 2018-03-07 DIAGNOSIS — Z6834 Body mass index (BMI) 34.0-34.9, adult: Secondary | ICD-10-CM | POA: Diagnosis not present

## 2018-03-07 DIAGNOSIS — E669 Obesity, unspecified: Secondary | ICD-10-CM | POA: Diagnosis not present

## 2018-05-06 DIAGNOSIS — Z6833 Body mass index (BMI) 33.0-33.9, adult: Secondary | ICD-10-CM | POA: Diagnosis not present

## 2018-05-06 DIAGNOSIS — E669 Obesity, unspecified: Secondary | ICD-10-CM | POA: Diagnosis not present

## 2018-08-31 DIAGNOSIS — Z30432 Encounter for removal of intrauterine contraceptive device: Secondary | ICD-10-CM | POA: Diagnosis not present

## 2018-08-31 DIAGNOSIS — K604 Rectal fistula: Secondary | ICD-10-CM | POA: Diagnosis not present

## 2018-09-12 DIAGNOSIS — K6289 Other specified diseases of anus and rectum: Secondary | ICD-10-CM | POA: Diagnosis not present

## 2018-09-14 DIAGNOSIS — R5381 Other malaise: Secondary | ICD-10-CM | POA: Diagnosis not present

## 2018-09-14 DIAGNOSIS — J329 Chronic sinusitis, unspecified: Secondary | ICD-10-CM | POA: Diagnosis not present

## 2018-09-14 DIAGNOSIS — J029 Acute pharyngitis, unspecified: Secondary | ICD-10-CM | POA: Diagnosis not present

## 2018-09-14 DIAGNOSIS — R5383 Other fatigue: Secondary | ICD-10-CM | POA: Diagnosis not present

## 2018-12-28 DIAGNOSIS — Z3200 Encounter for pregnancy test, result unknown: Secondary | ICD-10-CM | POA: Diagnosis not present

## 2018-12-28 DIAGNOSIS — O209 Hemorrhage in early pregnancy, unspecified: Secondary | ICD-10-CM | POA: Diagnosis not present

## 2018-12-28 DIAGNOSIS — Z3A01 Less than 8 weeks gestation of pregnancy: Secondary | ICD-10-CM | POA: Diagnosis not present

## 2018-12-30 DIAGNOSIS — Z3A01 Less than 8 weeks gestation of pregnancy: Secondary | ICD-10-CM | POA: Diagnosis not present

## 2018-12-30 DIAGNOSIS — O209 Hemorrhage in early pregnancy, unspecified: Secondary | ICD-10-CM | POA: Diagnosis not present

## 2018-12-30 DIAGNOSIS — O360110 Maternal care for anti-D [Rh] antibodies, first trimester, not applicable or unspecified: Secondary | ICD-10-CM | POA: Diagnosis not present

## 2019-01-05 DIAGNOSIS — N946 Dysmenorrhea, unspecified: Secondary | ICD-10-CM | POA: Diagnosis not present

## 2019-01-05 DIAGNOSIS — O039 Complete or unspecified spontaneous abortion without complication: Secondary | ICD-10-CM | POA: Diagnosis not present

## 2019-01-23 DIAGNOSIS — N96 Recurrent pregnancy loss: Secondary | ICD-10-CM | POA: Diagnosis not present

## 2019-04-28 DIAGNOSIS — O09291 Supervision of pregnancy with other poor reproductive or obstetric history, first trimester: Secondary | ICD-10-CM | POA: Diagnosis not present

## 2019-04-28 DIAGNOSIS — Z3200 Encounter for pregnancy test, result unknown: Secondary | ICD-10-CM | POA: Diagnosis not present

## 2019-04-28 DIAGNOSIS — Z3A01 Less than 8 weeks gestation of pregnancy: Secondary | ICD-10-CM | POA: Diagnosis not present

## 2019-04-28 DIAGNOSIS — Z3689 Encounter for other specified antenatal screening: Secondary | ICD-10-CM | POA: Diagnosis not present

## 2019-05-02 DIAGNOSIS — Z23 Encounter for immunization: Secondary | ICD-10-CM | POA: Diagnosis not present

## 2019-05-02 DIAGNOSIS — O09299 Supervision of pregnancy with other poor reproductive or obstetric history, unspecified trimester: Secondary | ICD-10-CM | POA: Diagnosis not present

## 2019-05-02 DIAGNOSIS — Z3A01 Less than 8 weeks gestation of pregnancy: Secondary | ICD-10-CM | POA: Diagnosis not present

## 2019-05-04 DIAGNOSIS — Z3A01 Less than 8 weeks gestation of pregnancy: Secondary | ICD-10-CM | POA: Diagnosis not present

## 2019-05-04 DIAGNOSIS — O09291 Supervision of pregnancy with other poor reproductive or obstetric history, first trimester: Secondary | ICD-10-CM | POA: Diagnosis not present

## 2019-05-25 DIAGNOSIS — Z3201 Encounter for pregnancy test, result positive: Secondary | ICD-10-CM | POA: Diagnosis not present

## 2019-06-16 DIAGNOSIS — Z3689 Encounter for other specified antenatal screening: Secondary | ICD-10-CM | POA: Diagnosis not present

## 2019-06-16 DIAGNOSIS — Z3A01 Less than 8 weeks gestation of pregnancy: Secondary | ICD-10-CM | POA: Diagnosis not present

## 2019-06-16 DIAGNOSIS — Z118 Encounter for screening for other infectious and parasitic diseases: Secondary | ICD-10-CM | POA: Diagnosis not present

## 2019-06-16 DIAGNOSIS — O09521 Supervision of elderly multigravida, first trimester: Secondary | ICD-10-CM | POA: Diagnosis not present

## 2019-07-12 DIAGNOSIS — Z3A14 14 weeks gestation of pregnancy: Secondary | ICD-10-CM | POA: Diagnosis not present

## 2019-07-12 DIAGNOSIS — O09522 Supervision of elderly multigravida, second trimester: Secondary | ICD-10-CM | POA: Diagnosis not present

## 2019-07-14 NOTE — L&D Delivery Note (Signed)
Delivery Note At 5:57 AM a viable female was delivered via Vaginal, Spontaneous (Presentation: Right Occiput Anterior).  APGAR: 8, 9; weight  .   Placenta status: Spontaneous, Intact after 15 minutes.  Cord: 3 vessels with the following complications: None.  Cord pH: n/a  Anesthesia: Epidural Episiotomy: None Lacerations: 2nd degree;Perineal Suture Repair: 3.0 vicryl rapide Est. Blood Loss (mL):  250cc Mom to postpartum.  Baby to Couplet care / Skin to Skin.  Lendon Colonel 12/28/2019, 6:17 AM

## 2019-08-08 DIAGNOSIS — Z3A18 18 weeks gestation of pregnancy: Secondary | ICD-10-CM | POA: Diagnosis not present

## 2019-08-08 DIAGNOSIS — Z361 Encounter for antenatal screening for raised alphafetoprotein level: Secondary | ICD-10-CM | POA: Diagnosis not present

## 2019-08-08 DIAGNOSIS — O09522 Supervision of elderly multigravida, second trimester: Secondary | ICD-10-CM | POA: Diagnosis not present

## 2019-08-21 DIAGNOSIS — K603 Anal fistula: Secondary | ICD-10-CM | POA: Diagnosis not present

## 2019-08-23 DIAGNOSIS — Z3A20 20 weeks gestation of pregnancy: Secondary | ICD-10-CM | POA: Diagnosis not present

## 2019-08-23 DIAGNOSIS — Z362 Encounter for other antenatal screening follow-up: Secondary | ICD-10-CM | POA: Diagnosis not present

## 2019-08-23 DIAGNOSIS — O09522 Supervision of elderly multigravida, second trimester: Secondary | ICD-10-CM | POA: Diagnosis not present

## 2019-09-19 DIAGNOSIS — Z3A24 24 weeks gestation of pregnancy: Secondary | ICD-10-CM | POA: Diagnosis not present

## 2019-09-19 DIAGNOSIS — O09522 Supervision of elderly multigravida, second trimester: Secondary | ICD-10-CM | POA: Diagnosis not present

## 2019-10-09 DIAGNOSIS — Z3482 Encounter for supervision of other normal pregnancy, second trimester: Secondary | ICD-10-CM | POA: Diagnosis not present

## 2019-10-09 DIAGNOSIS — Z3483 Encounter for supervision of other normal pregnancy, third trimester: Secondary | ICD-10-CM | POA: Diagnosis not present

## 2019-10-18 DIAGNOSIS — O09523 Supervision of elderly multigravida, third trimester: Secondary | ICD-10-CM | POA: Diagnosis not present

## 2019-10-18 DIAGNOSIS — Z3A28 28 weeks gestation of pregnancy: Secondary | ICD-10-CM | POA: Diagnosis not present

## 2019-10-18 DIAGNOSIS — O360120 Maternal care for anti-D [Rh] antibodies, second trimester, not applicable or unspecified: Secondary | ICD-10-CM | POA: Diagnosis not present

## 2019-10-18 DIAGNOSIS — Z3689 Encounter for other specified antenatal screening: Secondary | ICD-10-CM | POA: Diagnosis not present

## 2019-10-18 DIAGNOSIS — Z23 Encounter for immunization: Secondary | ICD-10-CM | POA: Diagnosis not present

## 2019-10-31 DIAGNOSIS — Z3A30 30 weeks gestation of pregnancy: Secondary | ICD-10-CM | POA: Diagnosis not present

## 2019-10-31 DIAGNOSIS — O360120 Maternal care for anti-D [Rh] antibodies, second trimester, not applicable or unspecified: Secondary | ICD-10-CM | POA: Diagnosis not present

## 2019-11-15 DIAGNOSIS — O3663X0 Maternal care for excessive fetal growth, third trimester, not applicable or unspecified: Secondary | ICD-10-CM | POA: Diagnosis not present

## 2019-11-15 DIAGNOSIS — Z3A32 32 weeks gestation of pregnancy: Secondary | ICD-10-CM | POA: Diagnosis not present

## 2019-11-21 ENCOUNTER — Ambulatory Visit: Payer: BC Managed Care – PPO | Attending: Internal Medicine

## 2019-11-21 DIAGNOSIS — Z23 Encounter for immunization: Secondary | ICD-10-CM

## 2019-11-21 NOTE — Progress Notes (Signed)
   Covid-19 Vaccination Clinic  Name:  Samera Macy. Stodghill    MRN: [id number] DOB: 1985-09-15  11/21/2019  Ms. Longhi was observed post Covid-19 immunization for 15 minutes without incident. She was provided with Vaccine Information Sheet and instruction to access the V-Safe system.   Ms. Dorough was instructed to call 911 with any severe reactions post vaccine: Marland Kitchen Difficulty breathing  . Swelling of face and throat  . A fast heartbeat  . A bad rash all over body  . Dizziness and weakness   Immunizations Administered    Name Date Dose VIS Date Route   Pfizer COVID-19 Vaccine 11/21/2019  9:43 AM 0.3 mL 09/06/2018 Intramuscular   Manufacturer: [employer], [employer]   Lot: [id number]   NDC: [address]-7

## 2019-11-28 DIAGNOSIS — Z3A34 34 weeks gestation of pregnancy: Secondary | ICD-10-CM | POA: Diagnosis not present

## 2019-11-28 DIAGNOSIS — O3663X0 Maternal care for excessive fetal growth, third trimester, not applicable or unspecified: Secondary | ICD-10-CM | POA: Diagnosis not present

## 2019-12-12 ENCOUNTER — Ambulatory Visit: Payer: BC Managed Care – PPO | Attending: Internal Medicine

## 2019-12-12 DIAGNOSIS — Z23 Encounter for immunization: Secondary | ICD-10-CM

## 2019-12-12 DIAGNOSIS — Z3685 Encounter for antenatal screening for Streptococcus B: Secondary | ICD-10-CM | POA: Diagnosis not present

## 2019-12-12 DIAGNOSIS — O3663X0 Maternal care for excessive fetal growth, third trimester, not applicable or unspecified: Secondary | ICD-10-CM | POA: Diagnosis not present

## 2019-12-12 DIAGNOSIS — Z3A36 36 weeks gestation of pregnancy: Secondary | ICD-10-CM | POA: Diagnosis not present

## 2019-12-12 NOTE — Progress Notes (Signed)
   Covid-19 Vaccination Clinic  Name:  Renee Bryan. Renee Bryan    MRN: [id number] DOB: March 08, 1986  12/12/2019  Renee Bryan was observed post Covid-19 immunization for 15 minutes without incident. She was provided with Vaccine Information Sheet and instruction to access the V-Safe system.   Renee Bryan was instructed to call 911 with any severe reactions post vaccine: Marland Kitchen Difficulty breathing  . Swelling of face and throat  . A fast heartbeat  . A bad rash all over body  . Dizziness and weakness   Immunizations Administered    Name Date Dose VIS Date Route   Pfizer COVID-19 Vaccine 12/12/2019  2:07 PM 0.3 mL 09/06/2018 Intramuscular   Manufacturer: [employer], [employer]   Lot: [id number]   NDC: [address]-3

## 2019-12-14 ENCOUNTER — Encounter (HOSPITAL_COMMUNITY): Payer: Self-pay | Admitting: *Deleted

## 2019-12-14 ENCOUNTER — Telehealth (HOSPITAL_COMMUNITY): Payer: Self-pay | Admitting: *Deleted

## 2019-12-14 LAB — OB RESULTS CONSOLE GBS: GBS: POSITIVE

## 2019-12-15 NOTE — Telephone Encounter (Signed)
Preadmission screen  

## 2019-12-18 ENCOUNTER — Encounter (HOSPITAL_COMMUNITY): Payer: Self-pay | Admitting: *Deleted

## 2019-12-18 ENCOUNTER — Telehealth (HOSPITAL_COMMUNITY): Payer: Self-pay | Admitting: *Deleted

## 2019-12-18 DIAGNOSIS — Z3A37 37 weeks gestation of pregnancy: Secondary | ICD-10-CM | POA: Diagnosis not present

## 2019-12-18 DIAGNOSIS — O3663X0 Maternal care for excessive fetal growth, third trimester, not applicable or unspecified: Secondary | ICD-10-CM | POA: Diagnosis not present

## 2019-12-18 NOTE — Telephone Encounter (Signed)
Preadmission screen  

## 2019-12-26 ENCOUNTER — Other Ambulatory Visit (HOSPITAL_COMMUNITY)
Admission: RE | Admit: 2019-12-26 | Discharge: 2019-12-26 | Disposition: A | Discharge status: Home / Self Care | Payer: BC Managed Care – PPO | Source: Ambulatory Visit | Attending: Obstetrics | Admitting: Obstetrics

## 2019-12-26 DIAGNOSIS — O99214 Obesity complicating childbirth: Secondary | ICD-10-CM | POA: Diagnosis not present

## 2019-12-26 DIAGNOSIS — Z20822 Contact with and (suspected) exposure to covid-19: Secondary | ICD-10-CM | POA: Diagnosis not present

## 2019-12-26 DIAGNOSIS — O26893 Other specified pregnancy related conditions, third trimester: Secondary | ICD-10-CM | POA: Diagnosis not present

## 2019-12-26 DIAGNOSIS — J45909 Unspecified asthma, uncomplicated: Secondary | ICD-10-CM | POA: Diagnosis not present

## 2019-12-26 DIAGNOSIS — O9952 Diseases of the respiratory system complicating childbirth: Secondary | ICD-10-CM | POA: Diagnosis not present

## 2019-12-26 DIAGNOSIS — O99824 Streptococcus B carrier state complicating childbirth: Secondary | ICD-10-CM | POA: Diagnosis not present

## 2019-12-26 DIAGNOSIS — O3663X0 Maternal care for excessive fetal growth, third trimester, not applicable or unspecified: Secondary | ICD-10-CM | POA: Diagnosis not present

## 2019-12-26 DIAGNOSIS — Z881 Allergy status to other antibiotic agents status: Secondary | ICD-10-CM | POA: Diagnosis not present

## 2019-12-26 DIAGNOSIS — Z01812 Encounter for preprocedural laboratory examination: Secondary | ICD-10-CM | POA: Insufficient documentation

## 2019-12-26 DIAGNOSIS — Z3A38 38 weeks gestation of pregnancy: Secondary | ICD-10-CM | POA: Diagnosis not present

## 2019-12-26 DIAGNOSIS — Z141 Cystic fibrosis carrier: Secondary | ICD-10-CM | POA: Diagnosis not present

## 2019-12-26 DIAGNOSIS — Z23 Encounter for immunization: Secondary | ICD-10-CM | POA: Diagnosis not present

## 2019-12-26 DIAGNOSIS — Z883 Allergy status to other anti-infective agents status: Secondary | ICD-10-CM | POA: Diagnosis not present

## 2019-12-26 DIAGNOSIS — E669 Obesity, unspecified: Secondary | ICD-10-CM | POA: Diagnosis not present

## 2019-12-26 DIAGNOSIS — Z3A39 39 weeks gestation of pregnancy: Secondary | ICD-10-CM | POA: Diagnosis not present

## 2019-12-26 LAB — SARS CORONAVIRUS 2 (TAT 6-24 HRS): SARS Coronavirus 2: NEGATIVE

## 2019-12-27 ENCOUNTER — Other Ambulatory Visit: Payer: Self-pay | Admitting: Obstetrics

## 2019-12-28 ENCOUNTER — Inpatient Hospital Stay (HOSPITAL_COMMUNITY)
Admission: RE | Admit: 2019-12-28 | Discharge: 2019-12-30 | DRG: 807 | Disposition: A | Discharge status: Home / Self Care | Payer: BC Managed Care – PPO | Attending: Obstetrics | Admitting: Obstetrics

## 2019-12-28 ENCOUNTER — Other Ambulatory Visit: Payer: Self-pay

## 2019-12-28 ENCOUNTER — Encounter (HOSPITAL_COMMUNITY): Payer: Self-pay | Admitting: Obstetrics

## 2019-12-28 ENCOUNTER — Inpatient Hospital Stay (HOSPITAL_COMMUNITY): Payer: BC Managed Care – PPO | Admitting: Anesthesiology

## 2019-12-28 ENCOUNTER — Inpatient Hospital Stay (HOSPITAL_COMMUNITY): Payer: BC Managed Care – PPO

## 2019-12-28 DIAGNOSIS — Z20822 Contact with and (suspected) exposure to covid-19: Secondary | ICD-10-CM | POA: Diagnosis present

## 2019-12-28 DIAGNOSIS — O99214 Obesity complicating childbirth: Secondary | ICD-10-CM | POA: Diagnosis present

## 2019-12-28 DIAGNOSIS — Z881 Allergy status to other antibiotic agents status: Secondary | ICD-10-CM

## 2019-12-28 DIAGNOSIS — O99824 Streptococcus B carrier state complicating childbirth: Secondary | ICD-10-CM | POA: Diagnosis present

## 2019-12-28 DIAGNOSIS — Z141 Cystic fibrosis carrier: Secondary | ICD-10-CM

## 2019-12-28 DIAGNOSIS — Z3490 Encounter for supervision of normal pregnancy, unspecified, unspecified trimester: Secondary | ICD-10-CM | POA: Diagnosis present

## 2019-12-28 DIAGNOSIS — Z3A39 39 weeks gestation of pregnancy: Secondary | ICD-10-CM

## 2019-12-28 DIAGNOSIS — E669 Obesity, unspecified: Secondary | ICD-10-CM | POA: Diagnosis present

## 2019-12-28 DIAGNOSIS — O26893 Other specified pregnancy related conditions, third trimester: Secondary | ICD-10-CM | POA: Diagnosis present

## 2019-12-28 DIAGNOSIS — Z883 Allergy status to other anti-infective agents status: Secondary | ICD-10-CM

## 2019-12-28 LAB — TYPE AND SCREEN
ABO/RH(D): B NEG
Antibody Screen: POSITIVE

## 2019-12-28 LAB — CBC
HCT: 31.5 % — ABNORMAL LOW (ref 36.0–46.0)
Hemoglobin: 10.2 g/dL — ABNORMAL LOW (ref 12.0–15.0)
MCH: 27.9 pg (ref 26.0–34.0)
MCHC: 32.4 g/dL (ref 30.0–36.0)
MCV: 86.1 fL (ref 80.0–100.0)
Platelets: 211 10*3/uL (ref 150–400)
RBC: 3.66 MIL/uL — ABNORMAL LOW (ref 3.87–5.11)
RDW: 13.6 % (ref 11.5–15.5)
WBC: 9.2 10*3/uL (ref 4.0–10.5)
nRBC: 0 % (ref 0.0–0.2)

## 2019-12-28 LAB — RPR: RPR Ser Ql: NONREACTIVE

## 2019-12-28 MED ORDER — ONDANSETRON HCL 4 MG/2ML IJ SOLN: 4.0000 mg | INTRAMUSCULAR | Status: DC | PRN

## 2019-12-28 MED ORDER — DIPHENHYDRAMINE HCL 50 MG/ML IJ SOLN: 12.5000 mg | INTRAMUSCULAR | Status: DC | PRN

## 2019-12-28 MED ORDER — BENZOCAINE-MENTHOL 20-0.5 % EX AERO
1.0000 "application " | INHALATION_SPRAY | CUTANEOUS | Status: DC | PRN
  Administered 2019-12-28 – 2019-12-30 (×2): 1 via TOPICAL
  Filled 2019-12-28 (×2): qty 56

## 2019-12-28 MED ORDER — ZOLPIDEM TARTRATE 5 MG PO TABS: 5.0000 mg | ORAL_TABLET | Freq: Every evening | ORAL | Status: DC | PRN

## 2019-12-28 MED ORDER — PHENYLEPHRINE 40 MCG/ML (10ML) SYRINGE FOR IV PUSH (FOR BLOOD PRESSURE SUPPORT)
80.0000 ug | PREFILLED_SYRINGE | INTRAVENOUS | Status: AC | PRN
  Administered 2019-12-28 (×3): 80 ug via INTRAVENOUS

## 2019-12-28 MED ORDER — FENTANYL CITRATE (PF) 100 MCG/2ML IJ SOLN
INTRAMUSCULAR | Status: AC
  Filled 2019-12-28: qty 2

## 2019-12-28 MED ORDER — TERBUTALINE SULFATE 1 MG/ML IJ SOLN: 0.2500 mg | Freq: Once | INTRAMUSCULAR | Status: DC | PRN

## 2019-12-28 MED ORDER — LACTATED RINGERS IV SOLN: INTRAVENOUS | Status: DC

## 2019-12-28 MED ORDER — OXYCODONE HCL 5 MG PO TABS: 5.0000 mg | ORAL_TABLET | ORAL | Status: DC | PRN

## 2019-12-28 MED ORDER — LACTATED RINGERS IV SOLN
500.0000 mL | INTRAVENOUS | Status: DC | PRN
  Administered 2019-12-28: 500 mL via INTRAVENOUS

## 2019-12-28 MED ORDER — LIDOCAINE HCL (PF) 1 % IJ SOLN: 30.0000 mL | INTRAMUSCULAR | Status: DC | PRN

## 2019-12-28 MED ORDER — SIMETHICONE 80 MG PO CHEW: 80.0000 mg | CHEWABLE_TABLET | ORAL | Status: DC | PRN

## 2019-12-28 MED ORDER — EPHEDRINE 5 MG/ML INJ: 10.0000 mg | INTRAVENOUS | Status: DC | PRN

## 2019-12-28 MED ORDER — ONDANSETRON HCL 4 MG PO TABS: 4.0000 mg | ORAL_TABLET | ORAL | Status: DC | PRN

## 2019-12-28 MED ORDER — PRENATAL MULTIVITAMIN CH
1.0000 | ORAL_TABLET | Freq: Every day | ORAL | Status: DC
  Administered 2019-12-28 – 2019-12-30 (×3): 1 via ORAL
  Filled 2019-12-28 (×3): qty 1

## 2019-12-28 MED ORDER — IBUPROFEN 600 MG PO TABS
600.0000 mg | ORAL_TABLET | Freq: Four times a day (QID) | ORAL | Status: DC
  Administered 2019-12-28 – 2019-12-30 (×9): 600 mg via ORAL
  Filled 2019-12-28 (×9): qty 1

## 2019-12-28 MED ORDER — FENTANYL CITRATE (PF) 100 MCG/2ML IJ SOLN
INTRAMUSCULAR | Status: DC | PRN
  Administered 2019-12-28 (×2): 50 ug via INTRAVENOUS

## 2019-12-28 MED ORDER — OXYTOCIN-SODIUM CHLORIDE 30-0.9 UT/500ML-% IV SOLN
1.0000 m[IU]/min | INTRAVENOUS | Status: DC
  Administered 2019-12-28: 2 m[IU]/min via INTRAVENOUS
  Filled 2019-12-28: qty 500

## 2019-12-28 MED ORDER — ACETAMINOPHEN 325 MG PO TABS: 650.0000 mg | ORAL_TABLET | ORAL | Status: DC | PRN

## 2019-12-28 MED ORDER — PHENYLEPHRINE 40 MCG/ML (10ML) SYRINGE FOR IV PUSH (FOR BLOOD PRESSURE SUPPORT)
80.0000 ug | PREFILLED_SYRINGE | INTRAVENOUS | Status: DC | PRN
  Administered 2019-12-28: 80 ug via INTRAVENOUS
  Filled 2019-12-28: qty 10

## 2019-12-28 MED ORDER — BUPIVACAINE HCL (PF) 0.25 % IJ SOLN
INTRAMUSCULAR | Status: DC | PRN
  Administered 2019-12-28 (×2): 5 mL via EPIDURAL

## 2019-12-28 MED ORDER — TETANUS-DIPHTH-ACELL PERTUSSIS 5-2.5-18.5 LF-MCG/0.5 IM SUSP: 0.5000 mL | Freq: Once | INTRAMUSCULAR | Status: DC

## 2019-12-28 MED ORDER — SENNOSIDES-DOCUSATE SODIUM 8.6-50 MG PO TABS
2.0000 | ORAL_TABLET | ORAL | Status: DC
  Administered 2019-12-28 – 2019-12-29 (×2): 2 via ORAL
  Filled 2019-12-28 (×2): qty 2

## 2019-12-28 MED ORDER — PHENYLEPHRINE 40 MCG/ML (10ML) SYRINGE FOR IV PUSH (FOR BLOOD PRESSURE SUPPORT): 80.0000 ug | PREFILLED_SYRINGE | INTRAVENOUS | Status: DC | PRN

## 2019-12-28 MED ORDER — SOD CITRATE-CITRIC ACID 500-334 MG/5ML PO SOLN: 30.0000 mL | ORAL | Status: DC | PRN

## 2019-12-28 MED ORDER — FENTANYL-BUPIVACAINE-NACL 0.5-0.125-0.9 MG/250ML-% EP SOLN
12.0000 mL/h | EPIDURAL | Status: DC | PRN
  Filled 2019-12-28: qty 250

## 2019-12-28 MED ORDER — OXYCODONE HCL 5 MG PO TABS: 10.0000 mg | ORAL_TABLET | ORAL | Status: DC | PRN

## 2019-12-28 MED ORDER — WITCH HAZEL-GLYCERIN EX PADS: 1.0000 "application " | MEDICATED_PAD | CUTANEOUS | Status: DC | PRN

## 2019-12-28 MED ORDER — DIPHENHYDRAMINE HCL 25 MG PO CAPS: 25.0000 mg | ORAL_CAPSULE | Freq: Four times a day (QID) | ORAL | Status: DC | PRN

## 2019-12-28 MED ORDER — RHO D IMMUNE GLOBULIN 1500 UNIT/2ML IJ SOSY
300.0000 ug | PREFILLED_SYRINGE | Freq: Once | INTRAMUSCULAR | Status: AC
  Administered 2019-12-28: 300 ug via INTRAVENOUS
  Filled 2019-12-28: qty 2

## 2019-12-28 MED ORDER — LIDOCAINE HCL (PF) 1 % IJ SOLN
INTRAMUSCULAR | Status: DC | PRN
  Administered 2019-12-28: 5 mL via EPIDURAL

## 2019-12-28 MED ORDER — SODIUM CHLORIDE (PF) 0.9 % IJ SOLN
INTRAMUSCULAR | Status: DC | PRN
  Administered 2019-12-28: 12 mL/h via EPIDURAL

## 2019-12-28 MED ORDER — LACTATED RINGERS IV SOLN: 500.0000 mL | Freq: Once | INTRAVENOUS | Status: DC

## 2019-12-28 MED ORDER — PENICILLIN G POT IN DEXTROSE 60000 UNIT/ML IV SOLN
3.0000 10*6.[IU] | INTRAVENOUS | Status: DC
  Administered 2019-12-28: 3 10*6.[IU] via INTRAVENOUS
  Filled 2019-12-28: qty 50

## 2019-12-28 MED ORDER — DIBUCAINE (PERIANAL) 1 % EX OINT: 1.0000 "application " | TOPICAL_OINTMENT | CUTANEOUS | Status: DC | PRN

## 2019-12-28 MED ORDER — FENTANYL-BUPIVACAINE-NACL 0.5-0.125-0.9 MG/250ML-% EP SOLN: 12.0000 mL/h | EPIDURAL | Status: DC | PRN

## 2019-12-28 MED ORDER — OXYTOCIN BOLUS FROM INFUSION
333.0000 mL | Freq: Once | INTRAVENOUS | Status: AC
  Administered 2019-12-28: 333 mL via INTRAVENOUS

## 2019-12-28 MED ORDER — OXYTOCIN-SODIUM CHLORIDE 30-0.9 UT/500ML-% IV SOLN
2.5000 [IU]/h | INTRAVENOUS | Status: DC
  Administered 2019-12-28: 2.5 [IU]/h via INTRAVENOUS

## 2019-12-28 MED ORDER — ONDANSETRON HCL 4 MG/2ML IJ SOLN: 4.0000 mg | Freq: Four times a day (QID) | INTRAMUSCULAR | Status: DC | PRN

## 2019-12-28 MED ORDER — SODIUM CHLORIDE 0.9 % IV SOLN
5.0000 10*6.[IU] | Freq: Once | INTRAVENOUS | Status: AC
  Administered 2019-12-28: 5 10*6.[IU] via INTRAVENOUS
  Filled 2019-12-28: qty 5

## 2019-12-28 MED ORDER — COCONUT OIL OIL
1.0000 "application " | TOPICAL_OIL | Status: DC | PRN
  Administered 2019-12-28: 1 via TOPICAL

## 2019-12-28 MED ORDER — LACTATED RINGERS IV SOLN
500.0000 mL | Freq: Once | INTRAVENOUS | Status: AC
  Administered 2019-12-28: 500 mL via INTRAVENOUS

## 2019-12-28 NOTE — Anesthesia Procedure Notes (Signed)
Epidural Patient location during procedure: OB Start time: 12/28/2019 4:15 AM End time: 12/28/2019 4:21 AM  Staffing Anesthesiologist: Shelton Silvas, MD Performed: anesthesiologist   Preanesthetic Checklist Completed: patient identified, IV checked, site marked, risks and benefits discussed, surgical consent, monitors and equipment checked, pre-op evaluation and timeout performed  Epidural Patient position: sitting Prep: ChloraPrep Patient monitoring: heart rate, continuous pulse ox and blood pressure Approach: midline Location: L3-L4 Injection technique: LOR saline  Needle:  Needle type: Tuohy  Needle gauge: 17 G Needle length: 9 cm Catheter type: closed end flexible Catheter size: 20 Guage Test dose: negative and 1.5% lidocaine  Assessment Events: blood not aspirated, injection not painful, no injection resistance and no paresthesia  Additional Notes LOR @ 6.5  Patient identified. Risks/Benefits/Options discussed with patient including but not limited to bleeding, infection, nerve damage, paralysis, failed block, incomplete pain control, headache, blood pressure changes, nausea, vomiting, reactions to medications, itching and postpartum back pain. Confirmed with bedside nurse the patient's most recent platelet count. Confirmed with patient that they are not currently taking any anticoagulation, have any bleeding history or any family history of bleeding disorders. Patient expressed understanding and wished to proceed. All questions were answered. Sterile technique was used throughout the entire procedure. Please see nursing notes for vital signs. Test dose was given through epidural catheter and negative prior to continuing to dose epidural or start infusion. Warning signs of high block given to the patient including shortness of breath, tingling/numbness in hands, complete motor block, or any concerning symptoms with instructions to call for help. Patient was given instructions  on fall risk and not to get out of bed. All questions and concerns addressed with instructions to call with any issues or inadequate analgesia.    Reason for block:procedure for pain

## 2019-12-28 NOTE — Plan of Care (Signed)
L&D careplan completed 

## 2019-12-28 NOTE — Progress Notes (Signed)
MOB was referred for history of depression/anxiety. * Referral screened out by Clinical Social Worker because none of the following criteria appear to apply: ~ History of anxiety/depression during this pregnancy, or of post-partum depression following prior delivery. ~ Diagnosis of anxiety and/or depression within last 3 years. No concerns noted in OB records. OR * MOB's symptoms currently being treated with medication and/or therapy.  Please contact the Clinical Social Worker if needs arise, by MOB request, or if MOB scores greater than 9/yes to question 10 on Edinburgh Postpartum Depression Screen.   Andranik Jeune Boyd-Gilyard, MSW, LCSW Clinical Social Work [phone]  

## 2019-12-28 NOTE — Anesthesia Postprocedure Evaluation (Signed)
Anesthesia Post Note  Patient: Renee Bryan  Procedure(s) Performed: AN AD HOC LABOR EPIDURAL     Patient location during evaluation: Mother Baby Anesthesia Type: Epidural Level of consciousness: awake Pain management: satisfactory to patient Vital Signs Assessment: post-procedure vital signs reviewed and stable Respiratory status: spontaneous breathing Cardiovascular status: stable Anesthetic complications: no   No complications documented.  Last Vitals:  Vitals:   12/28/19 0932 12/28/19 1340  BP: 117/70 (!) 102/56  Pulse: 75 67  Resp: 18 17  Temp:  36.4 C  SpO2:      Last Pain:  Vitals:   12/28/19 1340  TempSrc: Oral  PainSc:    Pain Goal:                   [employer]

## 2019-12-28 NOTE — Anesthesia Preprocedure Evaluation (Addendum)
Anesthesia Evaluation  Patient identified by MRN, date of birth, ID band Patient awake    Reviewed: Allergy & Precautions, Patient's Chart, lab work & pertinent test results  Airway Mallampati: II       Dental no notable dental hx.    Pulmonary asthma ,    Pulmonary exam normal        Cardiovascular Normal cardiovascular exam     Neuro/Psych PSYCHIATRIC DISORDERS Anxiety Depression negative neurological ROS     GI/Hepatic negative GI ROS,   Endo/Other    Renal/GU negative Renal ROS     Musculoskeletal   Abdominal   Peds  Hematology   Anesthesia Other Findings   Reproductive/Obstetrics (+) Pregnancy                            Anesthesia Physical Anesthesia Plan  ASA: III  Anesthesia Plan: Epidural   Post-op Pain Management:    Induction:   PONV Risk Score and Plan: 0  Airway Management Planned: Natural Airway  Additional Equipment: None  Intra-op Plan:   Post-operative Plan:   Informed Consent: I have reviewed the patients History and Physical, chart, labs and discussed the procedure including the risks, benefits and alternatives for the proposed anesthesia with the patient or authorized representative who has indicated his/her understanding and acceptance.       Plan Discussed with:   Anesthesia Plan Comments: (Lab Results      Component                Value               Date                      WBC                      9.2                 12/28/2019                HGB                      10.2 (L)            12/28/2019                HCT                      31.5 (L)            12/28/2019                MCV                      86.1                12/28/2019                PLT                      211                 12/28/2019           )       Anesthesia Quick Evaluation

## 2019-12-28 NOTE — Lactation Note (Signed)
This note was copied from a baby's chart. Lactation Consultation Note  Patient Name: Renee Bryan Today's Date: 12/28/2019  Mom is a P2, experienced breastfeeding mom to baby Renee Renee Bryan now 64 hours old. Mom with hx of anxiety.   Mom reports her first baby needed a nipple shield for the first 4 months.  Mom reports she breastfed for the first year.  After they got rid of the nipple shield no problems/concerns. Mom reports she has a breastpump for home use. Parents reprot they feel she is breastfeeding well.  Denies need for lactation assistance at this time.  Urged to call lactation as needed.     Maternal Data    Feeding Feeding Type: Breast Fed  [facility] Score                   Interventions    Lactation Tools Discussed/Used     Consult Status      Kabe Mckoy [name] 12/28/2019, 6:21 PM

## 2019-12-28 NOTE — H&P (Signed)
Renee Bryan is a 34 y.o. [id number] at [redacted]w[redacted]d presenting for elective induction at 39 weeks. Pt notes mild contractions that are now getting stronger since Pitocin was started. Good fetal movement, No vaginal bleeding, not leaking fluid.  [facility] at [facility] since eight wks -Dated by LMP consistent with 8-week ultrasound -Cystic fibrosis carrier, unknown baby negative -Obesity with 40 pound weight gain -B-  -GBS positive -AMA with normal panorama normal AFP -Perirectal fistula with intermittent abscess   Prenatal Transfer Tool  Maternal Diabetes: No Genetic Screening: Normal Maternal Ultrasounds/Referrals: Normal Fetal Ultrasounds or other Referrals:  None Maternal Substance Abuse:  No Significant Maternal Medications:  None Significant Maternal Lab Results: Group B Strep positive     OB History    Gravida  4   Para  1   Term  1   Preterm      AB  2   Living  1     SAB  2   TAB      Ectopic      Multiple  0   Live Births  1          Past Medical History:  Diagnosis Date  . Alopecia   . Anal fistula   . Anxiety   . Environmental allergies   . History of rectal abscess    PERIRECTAL 05/ 2017  S/P I&D  . IBS (irritable bowel syndrome)   . Iron deficiency anemia   . Mild asthma   . Seasonal and perennial allergic rhinitis   . Wears glasses    Past Surgical History:  Procedure Laterality Date  . COLONOSCOPY  1996  approx.  Marland Kitchen EVALUATION UNDER ANESTHESIA WITH ANAL FISSUROTOMY N/A 03/25/2017   Procedure: EXAM UNDER ANESTHESIA;  Surgeon: [name], MD;  Location: [facility];  Service: General;  Laterality: N/A;  . INCISION AND DRAINAGE PERIRECTAL ABSCESS Right 11/13/2015   Procedure: IRRIGATION AND DEBRIDEMENT PERIRECTAL ABSCESS;  Surgeon: Almond Lint, MD;  Location: [facility];  Service: General;  Laterality: Right;  . LIGATION OF INTERNAL FISTULA TRACT N/A 07/08/2017   Procedure: LIGATION OF INTERNAL FISTULA TRACT;  Surgeon:  [name], MD;  Location: [facility];  Service: General;  Laterality: N/A;  . PLACEMENT OF SETON N/A 03/25/2017   Procedure: PLACEMENT OF SETON;  Surgeon: [name], MD;  Location: [facility];  Service: General;  Laterality: N/A;   Family History: family history includes Breast cancer in her paternal grandmother; Thyroid disease in her maternal aunt, maternal grandmother, and mother. Social History:  reports that she has never smoked. She has never used smokeless tobacco. She reports that she does not drink alcohol and does not use drugs.  Review of Systems - Negative except Discomfort of pregnancy and contractions   Dilation: 3 Effacement (%): 40 Station: -2 Exam by:: Madalyn Rob, RN Blood pressure (!) 106/50, pulse 62, temperature 98.1 F (36.7 C), temperature source Oral, resp. rate 16, height 5\' 3"  (1.6 m), weight 112.8 kg.  Physical Exam:  Gen: well appearing, no distress, breathing heavy with contractions  Back: no CVAT Abd: gravid, NT, no RUQ pain LE: Trace edema, equal bilaterally, non-tender Toco: Every 2 to three FH: baseline 120s, accelerations present, no deceleratons, 10 beat variability  Prenatal labs: ABO, Rh: --/--/B NEG (06/17 0047) Antibody: POS (06/17 0047) Rubella:  Immune RPR:   Nonreactive HBsAg:   Negative HIV:   Negative GBS: Positive/-- (06/03 0000)  1 hr Glucola 94  Genetic screening  normal panorama, normal AFP Anatomy US normal   Assessment/Plan: 34 y.o. [id number] at [redacted]w[redacted]d Term elective induction of labor, Pitocin 2 x 2 Will AROM now GBS positive has gotten two and half hours of penicillin at this time   Ala Dach 12/28/2019, 3:23 AM

## 2019-12-28 NOTE — Lactation Note (Signed)
This note was copied from a baby's chart. Lactation Consultation Note  Patient Name: Renee Bryan Today's Date: 12/28/2019  Telephone call from RN that mom reports her nipples are really really sore.  RN asked LC to follow up with mom.  Mom breastfeeding on left breast in cradle hold on arrival and reports pain.  Assisted with breaking suction and relatching Sloane in cross cradle hold.  Mom reports a little uncomfortable but not excruciatingly painful like it was.Nipple was round and elongated when suction broken and infant came off.   Infant fussy/crying coming off and on the breast  Infant with wet/poopy diaper. LC changed infant diaper and got her to suck on gloved finger.  Infant may have short/tight frenulum.  Tongue just barely on/over gumline/LC unable to see tongue when pull back bottom lip with feeding. Urged mom to hand express and spoon feed past breastfeeding.  And then hand express and pat expressed mothers milk on nipples and air dry. Vary breastfeeding positions.  Support breast when feeding.  Keep hands back to get a deep latch. Keep chin and cheeks into breast and bring her in chin first. Keep her in close with her chest and body touching yours.  Use coconut oil only after putting breastmilk and letting it air dry. Discussed using manual pump to manually evert nipples prior to breastfeeding.  Urged to call lactation as needed.    Maternal Data    Feeding Feeding Type: Breast Fed  [facility] Score                   Interventions    Lactation Tools Discussed/Used     Consult Status      Renee Bryan [name] 12/28/2019, 6:26 PM

## 2019-12-29 LAB — RH IG WORKUP (INCLUDES ABO/RH)
ABO/RH(D): B NEG
Fetal Screen: NEGATIVE
Gestational Age(Wks): 39
Unit division: 0

## 2019-12-29 NOTE — Lactation Note (Addendum)
This note was copied from a baby's chart. Lactation Consultation Note  Patient Name: Renee Bryan KGURK'Y Date: 12/29/2019 Reason for consult: Initial assessment;Term;Infant weight loss;Other (Comment) (6 % weight loss)  Baby is 58 hours old  As LC entered the room baby was fussy and crying ., LC offered to assist,mom receptive,.  Checked diaper ,. Dry and assisted to latch on the left breast / football / noted areola edema and instructed mom on the use of the reverse pressure and latched with depth ,initially some pinching and baby fed for 15 mins with swallows and fell asleep.,  LC instructed on the the use of shells between feedings except when sleeping.,  LC also recommended due to areola edema prior to every  latch - breast massage,. Hand express,. prepump to stretch the nipple - areola complex and reverse pressure. Mom mentioned her nipples are getting sore,. No breakdown noted.,  Mom receptive to review and teaching.,  Mom mentioned she had to use a NS with her 1st baby.,  LC reassured mom the baby is latching well and a nipple shield is not needed.,  Per mom has a DEBP - [employer]ra at home.   Maternal Data Has patient been taught Hand Expression?: Yes Does the patient have breastfeeding experience prior to this delivery?: Yes  Feeding Feeding Type: Breast Fed  LATCH Score Latch: Grasps breast easily, tongue down, lips flanged, rhythmical sucking.  Audible Swallowing: Spontaneous and intermittent (increased to 2 with breast compressions)  Type of Nipple: Everted at rest and after stimulation (areola edema)  Comfort (Breast/Nipple): Filling, red/small blisters or bruises, mild/mod discomfort  Hold (Positioning): Assistance needed to correctly position infant at breast and maintain latch.  LATCH Score: 8  Interventions Interventions: Assisted with latch;Skin to skin;Breast feeding basics reviewed;Breast massage;Hand express;Reverse pressure;Breast compression;Adjust  position;Support pillows;Position options;Shells;Hand pump  Lactation Tools Discussed/Used Tools: Shells;Pump Shell Type: Inverted Breast pump type: Manual WIC Program: No   Consult Status Consult Status: Follow-up Date: 12/30/19 Follow-up type: In-patient    Matilde Sprang Marny Smethers 12/29/2019, 3:04 PM

## 2019-12-29 NOTE — Progress Notes (Signed)
Post Partum Day 1, SVD. G2P2. Girl  Subjective: no complaints, up ad lib, voiding and tolerating PO  Perineal pain okay Not sure of early discharge since baby having to work on feeds and baby's labs are not back yet   Objective: Blood pressure 110/69, pulse 62, temperature 98.1 F (36.7 C), temperature source Oral, resp. rate 17, height 5\' 3"  (1.6 m), weight 112.8 kg, SpO2 96 %, unknown if currently breastfeeding.  Physical Exam:  General: alert and cooperative CV RRR Lungs CTA bl Lochia: appropriate Uterine Fundus: firm Incision: healing well, no significant drainage DVT Evaluation: No evidence of DVT seen on physical exam.  Recent Labs    12/28/19 0047  HGB 10.2*  HCT 31.5*    Assessment/Plan: PPD #1. Declines early discharge, feels needs to work wit [facility] but can request later if she wants. Can have RN call On Call MD Dr [facility] after 1 pm Routine PP care F/up Dr [place] 6 weeks and sooner as needed, warning s/s and care discussed    LOS: 1 day   Renee Bryan 12/29/2019, 12:03 PM

## 2019-12-30 NOTE — Lactation Note (Signed)
This note was copied from a baby's chart. Lactation Consultation Note  Patient Name: Renee Bryan Date: 12/30/2019 Reason for consult: Follow-up assessment   P2, Baby 50 hours old.  Mother states baby cluster fed last night.  Stools yellow.  8.9% weight loss. Discussed during sleepy periods of keeping baby STS until feed. Feed on demand with cues.  Goal 8-12+ times per day after first 24 hrs.  Place baby STS if not cueing.  Reviewed engorgement care and monitoring voids/stools. Mother will call for LC to view next feeding.     Maternal Data    Feeding Feeding Type: Breast Fed  LATCH Score                   Interventions Interventions: Breast feeding basics reviewed;Hand pump  Lactation Tools Discussed/Used     Consult Status Consult Status: Complete Date: 12/30/19    Dahlia Byes [facility] 12/30/2019, 8:41 AM

## 2019-12-30 NOTE — Progress Notes (Addendum)
Post Partum Day 2, SVD. G2P2. Girl  Subjective: no complaints, up ad lib, voiding and tolerating PO  Perineal pain okay  Objective: Blood pressure 110/77, pulse 67, temperature 98.8 F (37.1 C), temperature source Oral, resp. rate 18, height 5\' 3"  (1.6 m), weight 112.8 kg, SpO2 96 %, unknown if currently breastfeeding.  Physical Exam:  General: alert and cooperative CV RRR Lungs CTA  Lochia: appropriate Uterine Fundus: firm Incision: healing well, no significant drainage DVT Evaluation: No evidence of DVT seen on physical exam.  Recent Labs    12/28/19 0047  HGB 10.2*  HCT 31.5*    Assessment/Plan: PPD #2.  DC  home Routine PP care F/up Dr 12/30/19 6 weeks and sooner as needed, warning s/s and care discussed    LOS: 2 days   Renee Bryan J 12/30/2019, 10:05 AM

## 2019-12-30 NOTE — Discharge Summary (Signed)
Postpartum Discharge Summary  Date of Service updated6/19/21     Patient Name: Renee Bryan DOB: October 27, 1985 MRN: [id number]  Date of admission: 12/28/2019 Delivery date:12/28/2019  Delivering provider: Aloha Gell  Date of discharge: 12/30/2019  Admitting diagnosis: Encounter for induction of labor [Z34.90] Intrauterine pregnancy: [redacted]w[redacted]d    Secondary diagnosis:  Principal Problem:   Postpartum care following vaginal delivery (6/17) Active Problems:   Encounter for induction of labor   SVD (spontaneous vaginal delivery)   Second degree perineal laceration  Additional problems: none    Discharge diagnosis: Term Pregnancy Delivered                                              Post partum procedures:na Augmentation: Pitocin Complications: None  Hospital course: Onset of Labor With Vaginal Delivery      34y.o. yo [id number] admitted in Active Labor on 12/28/2019. Patient had an uncomplicated labor course as follows:  Membrane Rupture Time/Date: 3:29 AM ,12/28/2019   Delivery Method:Vaginal, Spontaneous  Episiotomy: None  Lacerations:  2nd degree;Perineal  Patient had an uncomplicated postpartum course.  She is ambulating, tolerating a regular diet, passing flatus, and urinating well. Patient is discharged home in stable condition on 12/30/19.  Newborn Data: Birth date:12/28/2019  Birth time:5:57 AM  Gender:Female  Living status:Living  Apgars:8 ,9  Weight:3201 g   Magnesium Sulfate received: No BMZ received: No Rhophylac:N/A MMR:Yes T-DaP:Given prenatally Flu: Yes Transfusion:No  Physical exam  Vitals:   12/29/19 0615 12/29/19 1523 12/29/19 2100 12/30/19 0530  BP: 110/69 96/66 106/68 110/77  Pulse: 62 65 66 67  Resp: 17 18 17 18   Temp: 98.1 F (36.7 C) 97.8 F (36.6 C) 97.9 F (36.6 C) 98.8 F (37.1 C)  TempSrc: Oral Oral Oral Oral  SpO2: 96%     Weight:      Height:       General: alert, cooperative and no distress Lochia:  appropriate Uterine Fundus: firm Incision: Healing well with no significant drainage, No significant erythema DVT Evaluation: No evidence of DVT seen on physical exam. Negative Homan's sign. Labs: Lab Results  Component Value Date   WBC 9.2 12/28/2019   HGB 10.2 (L) 12/28/2019   HCT 31.5 (L) 12/28/2019   MCV 86.1 12/28/2019   PLT 211 12/28/2019   CMP Latest Ref Rng & Units 11/13/2015  Glucose 65 - 99 mg/dL 89  BUN 6 - 23 mg/dL -  Creatinine 0.4 - 1.2 mg/dL -  Sodium 135 - 145 mmol/L 141  Potassium 3.5 - 5.1 mmol/L 3.4(L)  Chloride 96 - 112 meq/L -  CO2 19 - 32 meq/L -  Calcium 8.4 - 10.5 mg/dL -  Total Protein 6.0 - 8.3 g/dL -  Total Bilirubin 0.3 - 1.2 mg/dL -  Alkaline Phos 39 - 117 units/L -  AST 0 - 37 units/L -  ALT 0 - 35 units/L -   Edinburgh Score: Edinburgh Postnatal Depression Scale Screening Tool 12/28/2019  I have been able to laugh and see the funny side of things. 0  I have looked forward with enjoyment to things. 0  I have blamed myself unnecessarily when things went wrong. 1  I have been anxious or worried for no good reason. 1  I have felt scared or panicky for no good reason. 1  Things have been getting  on top of me. 1  I have been so unhappy that I have had difficulty sleeping. 0  I have felt sad or miserable. 0  I have been so unhappy that I have been crying. 0  The thought of harming myself has occurred to me. 0  Edinburgh Postnatal Depression Scale Total 4      After visit meds:  Allergies as of 12/30/2019      Reactions   Zoloft [sertraline Hcl] Rash   Face and arms only.   Diflucan [fluconazole]    Zithromax [azithromycin] Other (See Comments)    "severe stomach pain"      Medication List    STOP taking these medications   oxyCODONE 5 MG immediate release tablet Commonly known as: Oxy IR/ROXICODONE     TAKE these medications   acetaminophen 325 MG tablet Commonly known as: TYLENOL Take 650 mg by mouth daily as needed for mild pain  or headache.   albuterol 108 (90 Base) MCG/ACT inhaler Commonly known as: VENTOLIN HFA Inhale 2 puffs into the lungs every 6 (six) hours as needed for shortness of breath.   cetirizine 10 MG tablet Commonly known as: ZYRTEC Take 10 mg by mouth every morning.   cholecalciferol 1000 units tablet Commonly known as: VITAMIN D Take 2,000 Units by mouth every morning.   prenatal multivitamin Tabs tablet Take 1 tablet by mouth daily at 12 noon.   Probiotic Daily Caps Take 1 capsule by mouth every morning.        Discharge home in stable condition Infant Feeding: Breast Infant Disposition:home with mother Discharge instruction: per After Visit Summary and Postpartum booklet. Activity: Advance as tolerated. Pelvic rest for 6 weeks.  Diet: routine diet Anticipated Birth Control: Unsure Postpartum Appointment:6 weeks Additional Postpartum F/U: na Future Appointments:No future appointments. Follow up Visit: 6w      12/30/2019 Lovenia Kim, MD

## 2020-01-05 DIAGNOSIS — M5489 Other dorsalgia: Secondary | ICD-10-CM | POA: Diagnosis not present

## 2020-02-05 DIAGNOSIS — Z30430 Encounter for insertion of intrauterine contraceptive device: Secondary | ICD-10-CM | POA: Diagnosis not present

## 2020-03-07 DIAGNOSIS — N760 Acute vaginitis: Secondary | ICD-10-CM | POA: Diagnosis not present

## 2020-03-07 DIAGNOSIS — Z30431 Encounter for routine checking of intrauterine contraceptive device: Secondary | ICD-10-CM | POA: Diagnosis not present

## 2022-10-01 ENCOUNTER — Ambulatory Visit: Payer: BLUE CROSS/BLUE SHIELD | Admitting: Nurse Practitioner

## 2022-10-01 VITALS — BP 134/76 | HR 65 | Temp 98.3°F | Ht 63.0 in | Wt 225.2 lb

## 2022-10-01 DIAGNOSIS — E669 Obesity, unspecified: Secondary | ICD-10-CM | POA: Insufficient documentation

## 2022-10-01 DIAGNOSIS — R350 Frequency of micturition: Secondary | ICD-10-CM | POA: Insufficient documentation

## 2022-10-01 DIAGNOSIS — Z131 Encounter for screening for diabetes mellitus: Secondary | ICD-10-CM | POA: Insufficient documentation

## 2022-10-01 DIAGNOSIS — Z136 Encounter for screening for cardiovascular disorders: Secondary | ICD-10-CM

## 2022-10-01 DIAGNOSIS — Z13220 Encounter for screening for lipoid disorders: Secondary | ICD-10-CM | POA: Diagnosis not present

## 2022-10-01 DIAGNOSIS — F418 Other specified anxiety disorders: Secondary | ICD-10-CM

## 2022-10-01 DIAGNOSIS — R030 Elevated blood-pressure reading, without diagnosis of hypertension: Secondary | ICD-10-CM

## 2022-10-01 NOTE — Assessment & Plan Note (Signed)
Blood pressure borderline today.  For now monitor closely follow-up in 4 to 6 weeks.  In the meantime focus on lifestyle modification and monitor at home blood pressures, patient encouraged to bring log with her to office at next visit.

## 2022-10-01 NOTE — Assessment & Plan Note (Signed)
Labs ordered, further recommendations may be made based upon these results. 

## 2022-10-01 NOTE — Assessment & Plan Note (Signed)
Labs ordered, further recommendations may be made based upon the results. 

## 2022-10-01 NOTE — Patient Instructions (Signed)
1100-1200 calories/day Track calories in an app -- My fitness pal; fitbit Low glycemic index Use a food scale to accurately track calories 150 min/week 60-80 ounces per day   Wegovy Zepbound Saxenda

## 2022-10-01 NOTE — Assessment & Plan Note (Signed)
Check UA, further recommendations may be made based upon the results.

## 2022-10-01 NOTE — Progress Notes (Signed)
New Patient Office Visit  Subjective    Patient ID: Renee Bryan, female    DOB: May 13, 1986  Age: 37 y.o. MRN: [id number]  CC:  Chief Complaint  Patient presents with   New Patient (Initial Visit)    HPI Renee Bryan presents to establish care.  Has been following up with OB/GYN routinely, but has not had primary care provider.  Establishes today mainly for concern of elevated blood pressure. Has multiple concerns as listed below:  Elevated Blood pressure: Reports no personal history of hypertension.  Has noticed blood pressure at home has been elevated as well as in the urgent care when seen for acute needs.  Most recent readings at home were 142/109 and 134/90.  Does have intermittent dizziness and headaches but no chest pain or shortness of breath.  Obesity: Reports she is at her highest weight, has had a hard time with weight loss.  Would like to discuss this.  Situational Anxiety: Experiences anxiety when leaving the house, anxiety with work, and anxiety with travel.  Does not feel overly depressed.  Has trialed Zoloft in the past but developed a rash after starting this.  Urinary frequency: Has been ongoing for a while, triggers include possible anxiety about leaving the home.  Denies dysuria.  Outpatient Encounter Medications as of 10/01/2022  Medication Sig   cetirizine (ZYRTEC) 10 MG tablet Take 10 mg by mouth every morning.    levonorgestrel (MIRENA) 20 MCG/DAY IUD 1 each by Intrauterine route once.   albuterol (PROVENTIL HFA;VENTOLIN HFA) 108 (90 BASE) MCG/ACT inhaler Inhale 2 puffs into the lungs every 6 (six) hours as needed for shortness of breath.  (Patient not taking: Reported on 10/01/2022)   [DISCONTINUED] acetaminophen (TYLENOL) 325 MG tablet Take 650 mg by mouth daily as needed for mild pain or headache. (Patient not taking: Reported on 10/01/2022)   [DISCONTINUED] cholecalciferol (VITAMIN D) 1000 UNITS tablet Take 2,000 Units by mouth every morning.     [DISCONTINUED] Prenatal Vit-Fe Fumarate-FA (PRENATAL MULTIVITAMIN) TABS tablet Take 1 tablet by mouth daily at 12 noon.   [DISCONTINUED] Probiotic Product (PROBIOTIC DAILY) CAPS Take 1 capsule by mouth every morning.   No facility-administered encounter medications on file as of 10/01/2022.    Past Medical History:  Diagnosis Date   Alopecia    Anal fistula    Anxiety    Environmental allergies    History of rectal abscess    PERIRECTAL 05/ 2017  S/P I&D   IBS (irritable bowel syndrome)    Iron deficiency anemia    Mild asthma    Seasonal and perennial allergic rhinitis    Wears glasses     Past Surgical History:  Procedure Laterality Date   COLONOSCOPY  1996  approx.   EVALUATION UNDER ANESTHESIA WITH ANAL FISSUROTOMY N/A 03/25/2017   Procedure: EXAM UNDER ANESTHESIA;  Surgeon: Leighton Ruff, MD;  Location: [facility];  Service: General;  Laterality: N/A;   INCISION AND DRAINAGE PERIRECTAL ABSCESS Right 11/13/2015   Procedure: IRRIGATION AND DEBRIDEMENT PERIRECTAL ABSCESS;  Surgeon: Stark Klein, MD;  Location: [facility];  Service: General;  Laterality: Right;   LIGATION OF INTERNAL FISTULA TRACT N/A 07/08/2017   Procedure: LIGATION OF INTERNAL FISTULA TRACT;  Surgeon: Leighton Ruff, MD;  Location: [facility];  Service: General;  Laterality: N/A;   [place] N/A 03/25/2017   Procedure: PLACEMENT OF SETON;  Surgeon: Leighton Ruff, MD;  Location: [facility];  Service: General;  Laterality:  N/A;    Family History  Problem Relation Age of Onset   Breast cancer Paternal Grandmother    Thyroid disease Mother    Thyroid disease Maternal Aunt    Thyroid disease Maternal Grandmother     Social History   Socioeconomic History   Marital status: Married    Spouse name: Not on file   Number of children: Not on file   Years of education: Not on file   Highest education level: Not on file  Occupational History   Not on file   Tobacco Use   Smoking status: Never   Smokeless tobacco: Never  Vaping Use   Vaping Use: Never used  Substance and Sexual Activity   Alcohol use: No   Drug use: No   Sexual activity: Yes    Birth control/protection: None    Comment: Mirena IUD placed in 2016  Other Topics Concern   Not on file  Social History Narrative   Not on file   Social Determinants of Health   Financial Resource Strain: Not on file  Food Insecurity: Not on file  Transportation Needs: Not on file  Physical Activity: Not on file  Stress: Not on file  Social Connections: Not on file  Intimate Partner Violence: Not on file    Review of Systems  Eyes:  Negative for blurred vision.  Respiratory:  Negative for shortness of breath.   Cardiovascular:  Negative for chest pain.  Neurological:  Positive for dizziness and headaches.        Objective    BP 134/76   Pulse 65   Temp 98.3 F (36.8 C) (Temporal)   Ht 5\' 3"  (1.6 m)   Wt 225 lb 4 oz (102.2 kg)   SpO2 98%   BMI 39.90 kg/m   Physical Exam Vitals reviewed.  Constitutional:      General: She is not in acute distress.    Appearance: Normal appearance.  HENT:     Head: Normocephalic and atraumatic.  Neck:     Vascular: No carotid bruit.  Cardiovascular:     Rate and Rhythm: Normal rate and regular rhythm.     Pulses: Normal pulses.     Heart sounds: Normal heart sounds.  Pulmonary:     Effort: Pulmonary effort is normal.     Breath sounds: Normal breath sounds.  Skin:    General: Skin is warm and dry.  Neurological:     General: No focal deficit present.     Mental Status: She is alert and oriented to person, place, and time.  Psychiatric:        Mood and Affect: Mood normal.        Behavior: Behavior normal.        Judgment: Judgment normal.        10/01/2022    4:42 PM  PHQ9 SCORE ONLY  PHQ-9 Total Score 3      10/01/2022    4:42 PM  GAD 7 : Generalized Anxiety Score  Nervous, Anxious, on Edge 1  Control/stop  worrying 0  Worry too much - different things 0  Trouble relaxing 0  Restless 0  Easily annoyed or irritable 0  Afraid - awful might happen 1  Total GAD 7 Score 2  Anxiety Difficulty Somewhat difficult          Assessment & Plan:   Problem List Items Addressed This Visit       Other   Obesity without serious comorbidity - Primary  Chronic, encouraged calorie deficit, low glycemic index diet, proper hydration, and regular exercise.  Patient to follow-up in 4 to 6 weeks to check progress.      Relevant Orders   CBC   Comprehensive metabolic panel   Hemoglobin A1c   Lipid panel   TSH   Insulin, random   Diabetes mellitus screening    Labs ordered, further recommendations may be made based upon the results.      Relevant Orders   CBC   Comprehensive metabolic panel   Hemoglobin A1c   Lipid panel   TSH   Encounter for lipid screening for cardiovascular disease    Labs ordered, further recommendations may be made based upon these results.      Relevant Orders   CBC   Comprehensive metabolic panel   Hemoglobin A1c   Lipid panel   TSH   Frequency of urination    Check UA, further recommendations may be made based upon the results.        Relevant Orders   Urinalysis with Culture, if indicated   Elevated blood pressure reading    Blood pressure borderline today.  For now monitor closely follow-up in 4 to 6 weeks.  In the meantime focus on lifestyle modification and monitor at home blood pressures, patient encouraged to bring log with her to office at next visit.      Situational anxiety    GAD-7: 2, PHQ-9: 3 today.  For now monitor closely, may discuss pharmacological therapy pending lab results.       Return in about 6 weeks (around 11/12/2022) for F/U with Judson Roch.   Ailene Ards, NP

## 2022-10-01 NOTE — Assessment & Plan Note (Signed)
GAD-7: 2, PHQ-9: 3 today.  For now monitor closely, may discuss pharmacological therapy pending lab results.

## 2022-10-01 NOTE — Assessment & Plan Note (Signed)
Chronic, encouraged calorie deficit, low glycemic index diet, proper hydration, and regular exercise.  Patient to follow-up in 4 to 6 weeks to check progress.

## 2022-10-16 ENCOUNTER — Other Ambulatory Visit: Payer: BLUE CROSS/BLUE SHIELD

## 2022-10-16 ENCOUNTER — Encounter: Payer: Self-pay | Admitting: Nurse Practitioner

## 2022-10-16 ENCOUNTER — Other Ambulatory Visit (INDEPENDENT_AMBULATORY_CARE_PROVIDER_SITE_OTHER): Payer: BLUE CROSS/BLUE SHIELD

## 2022-10-16 ENCOUNTER — Other Ambulatory Visit: Payer: Self-pay | Admitting: Nurse Practitioner

## 2022-10-16 DIAGNOSIS — E669 Obesity, unspecified: Secondary | ICD-10-CM | POA: Diagnosis not present

## 2022-10-16 DIAGNOSIS — Z131 Encounter for screening for diabetes mellitus: Secondary | ICD-10-CM

## 2022-10-16 DIAGNOSIS — Z13220 Encounter for screening for lipoid disorders: Secondary | ICD-10-CM

## 2022-10-16 DIAGNOSIS — R350 Frequency of micturition: Secondary | ICD-10-CM

## 2022-10-16 DIAGNOSIS — N390 Urinary tract infection, site not specified: Secondary | ICD-10-CM

## 2022-10-16 DIAGNOSIS — Z136 Encounter for screening for cardiovascular disorders: Secondary | ICD-10-CM

## 2022-10-16 LAB — HEMOGLOBIN A1C: Hgb A1c MFr Bld: 5.2 % (ref 4.6–6.5)

## 2022-10-16 LAB — COMPREHENSIVE METABOLIC PANEL
ALT: 10 U/L (ref 0–35)
AST: 12 U/L (ref 0–37)
Albumin: 4.3 g/dL (ref 3.5–5.2)
Alkaline Phosphatase: 58 U/L (ref 39–117)
BUN: 17 mg/dL (ref 6–23)
CO2: 26 mEq/L (ref 19–32)
Calcium: 9.2 mg/dL (ref 8.4–10.5)
Chloride: 105 mEq/L (ref 96–112)
Creatinine, Ser: 0.71 mg/dL (ref 0.40–1.20)
GFR: 107.74 mL/min (ref >60.00)
Glucose, Bld: 93 mg/dL (ref 70–99)
Potassium: 4.2 mEq/L (ref 3.5–5.1)
Sodium: 139 mEq/L (ref 135–145)
Total Bilirubin: 0.6 mg/dL (ref 0.2–1.2)
Total Protein: 7.5 g/dL (ref 6.0–8.3)

## 2022-10-16 LAB — URINALYSIS WITH CULTURE, IF INDICATED
Bilirubin Urine: NEGATIVE
Hgb urine dipstick: NEGATIVE
Ketones, ur: NEGATIVE
Leukocytes,Ua: NEGATIVE
Nitrite: NEGATIVE
Specific Gravity, Urine: 1.02 (ref 1.000–1.030)
Total Protein, Urine: NEGATIVE
Urine Glucose: NEGATIVE
Urobilinogen, UA: 0.2 (ref 0.0–1.0)
pH: 6 (ref 5.0–8.0)

## 2022-10-16 LAB — CBC
HCT: 39.7 % (ref 36.0–46.0)
Hemoglobin: 13.3 g/dL (ref 12.0–15.0)
MCHC: 33.5 g/dL (ref 30.0–36.0)
MCV: 85 fl (ref 78.0–100.0)
Platelets: 260 10*3/uL (ref 150.0–400.0)
RBC: 4.67 Mil/uL (ref 3.87–5.11)
RDW: 13.7 % (ref 11.5–15.5)
WBC: 5.5 10*3/uL (ref 4.0–10.5)

## 2022-10-16 LAB — LIPID PANEL
Cholesterol: 165 mg/dL (ref 0–200)
HDL: 48.7 mg/dL (ref >39.00)
LDL Cholesterol: 105 mg/dL — ABNORMAL HIGH (ref 0–99)
NonHDL: 115.98
Total CHOL/HDL Ratio: 3
Triglycerides: 57 mg/dL (ref 0.0–149.0)
VLDL: 11.4 mg/dL (ref 0.0–40.0)

## 2022-10-16 LAB — TSH: TSH: 2.5 u[IU]/mL (ref 0.35–5.50)

## 2022-10-16 NOTE — Progress Notes (Signed)
Estimated Creatinine Clearance: 108.8 mL/min (by C-G formula based on SCr of 0.71 mg/dL).  

## 2022-10-17 LAB — URINE CULTURE

## 2022-10-18 LAB — URINE CULTURE
MICRO NUMBER:: 14788353
SPECIMEN QUALITY:: ADEQUATE

## 2022-10-19 ENCOUNTER — Other Ambulatory Visit: Payer: Self-pay | Admitting: Nurse Practitioner

## 2022-10-19 DIAGNOSIS — N390 Urinary tract infection, site not specified: Secondary | ICD-10-CM

## 2022-10-19 LAB — INSULIN, RANDOM: Insulin: 6.6 u[IU]/mL

## 2022-10-19 MED ORDER — AMOXICILLIN-POT CLAVULANATE 500-125 MG PO TABS
1.0000 | ORAL_TABLET | Freq: Two times a day (BID) | ORAL | 0 refills | Status: AC
Start: 2022-10-19 — End: ?

## 2022-10-19 NOTE — Progress Notes (Signed)
Estimated Creatinine Clearance: 108.8 mL/min (by C-G formula based on SCr of 0.71 mg/dL).

## 2022-11-13 ENCOUNTER — Ambulatory Visit: Payer: BLUE CROSS/BLUE SHIELD | Admitting: Nurse Practitioner

## 2024-03-23 LAB — HM MAMMOGRAPHY
